# Patient Record
Sex: Female | Born: 1995 | Race: Black or African American | Hispanic: No | Marital: Single | Smoking: Never smoker
Health system: Southern US, Community
[De-identification: ages and names within clinical notes are randomized; demographics above are authoritative.]

## PROBLEM LIST (undated history)

## (undated) DIAGNOSIS — D219 Benign neoplasm of connective and other soft tissue, unspecified: Secondary | ICD-10-CM

---

## 2017-11-13 ENCOUNTER — Other Ambulatory Visit: Payer: Self-pay

## 2017-11-13 ENCOUNTER — Emergency Department (HOSPITAL_COMMUNITY)
Admission: EM | Admit: 2017-11-13 | Discharge: 2017-11-14 | Disposition: A | Discharge status: Home / Self Care | Payer: Self-pay | Attending: Emergency Medicine | Admitting: Emergency Medicine

## 2017-11-13 ENCOUNTER — Encounter (HOSPITAL_COMMUNITY): Payer: Self-pay | Admitting: Emergency Medicine

## 2017-11-13 ENCOUNTER — Other Ambulatory Visit (HOSPITAL_COMMUNITY): Payer: Self-pay | Admitting: Radiology

## 2017-11-13 ENCOUNTER — Emergency Department (HOSPITAL_COMMUNITY): Payer: Self-pay

## 2017-11-13 DIAGNOSIS — D219 Benign neoplasm of connective and other soft tissue, unspecified: Secondary | ICD-10-CM

## 2017-11-13 DIAGNOSIS — N926 Irregular menstruation, unspecified: Secondary | ICD-10-CM | POA: Insufficient documentation

## 2017-11-13 DIAGNOSIS — N83201 Unspecified ovarian cyst, right side: Secondary | ICD-10-CM | POA: Insufficient documentation

## 2017-11-13 DIAGNOSIS — D252 Subserosal leiomyoma of uterus: Secondary | ICD-10-CM | POA: Insufficient documentation

## 2017-11-13 DIAGNOSIS — N83202 Unspecified ovarian cyst, left side: Secondary | ICD-10-CM | POA: Insufficient documentation

## 2017-11-13 LAB — BASIC METABOLIC PANEL
Anion gap: 15 (ref 5–15)
BUN: 13 mg/dL (ref 6–20)
CO2: 25 mmol/L (ref 22–32)
Calcium: 9.5 mg/dL (ref 8.9–10.3)
Chloride: 99 mmol/L (ref 98–111)
Creatinine, Ser: 0.98 mg/dL (ref 0.44–1.00)
GFR calc Af Amer: >60 mL/min (ref >60)
GFR calc non Af Amer: >60 mL/min (ref >60)
Glucose, Bld: 88 mg/dL (ref 70–99)
Potassium: 4 mmol/L (ref 3.5–5.1)
Sodium: 139 mmol/L (ref 135–145)

## 2017-11-13 LAB — URINALYSIS, ROUTINE W REFLEX MICROSCOPIC
Bilirubin Urine: NEGATIVE
GLUCOSE, UA: NEGATIVE mg/dL
KETONES UR: NEGATIVE mg/dL
Nitrite: NEGATIVE
PROTEIN: NEGATIVE mg/dL
Specific Gravity, Urine: 1.023 (ref 1.005–1.030)
pH: 5 (ref 5.0–8.0)

## 2017-11-13 LAB — CBC WITH DIFFERENTIAL/PLATELET
Abs Immature Granulocytes: 0 10*3/uL (ref 0.0–0.1)
Basophils Absolute: 0.1 10*3/uL (ref 0.0–0.1)
Basophils Relative: 1 %
Eosinophils Absolute: 0.2 10*3/uL (ref 0.0–0.7)
Eosinophils Relative: 4 %
HCT: 44.6 % (ref 36.0–46.0)
Hemoglobin: 13.8 g/dL (ref 12.0–15.0)
Immature Granulocytes: 0 %
Lymphocytes Relative: 37 %
Lymphs Abs: 2.2 10*3/uL (ref 0.7–4.0)
MCH: 29.1 pg (ref 26.0–34.0)
MCHC: 30.9 g/dL (ref 30.0–36.0)
MCV: 94.1 fL (ref 78.0–100.0)
Monocytes Absolute: 0.5 10*3/uL (ref 0.1–1.0)
Monocytes Relative: 8 %
Neutro Abs: 3.1 10*3/uL (ref 1.7–7.7)
Neutrophils Relative %: 50 %
Platelets: 326 10*3/uL (ref 150–400)
RBC: 4.74 MIL/uL (ref 3.87–5.11)
RDW: 11.8 % (ref 11.5–15.5)
WBC: 6.1 10*3/uL (ref 4.0–10.5)

## 2017-11-13 LAB — I-STAT BETA HCG BLOOD, ED (MC, WL, AP ONLY): I-stat hCG, quantitative: <5 m[IU]/mL (ref <5)

## 2017-11-13 NOTE — ED Provider Notes (Signed)
[facility] EMERGENCY DEPARTMENT Provider Note   CSN: [id number] Arrival date & time: 11/13/17  1842     History   Chief Complaint Chief Complaint  Patient presents with  . Vaginal Bleeding  . Dizziness    HPI Tammy Shah is a 22 y.o. female.  The history is provided by the patient and medical records.  Vaginal Bleeding  Primary symptoms include vaginal bleeding. Associated symptoms include dizziness.  Dizziness   22 year old female presenting to the ED with irregular vaginal bleeding.  Patient reports she had a normal menstrual cycle at the end of July, was off for 5 days and then began bleeding again first week in August.  States cycle lasted for another 3-4 days and has since started again.  She does report some mild lightheadedness which is common during her menstrual cycles.  She denies any significant abdominal or pelvic pain.  She is currently sexually active with one female partner, uses protection.  She denies any new vaginal discharge or irritation.  States generally her cycles are very regular occurring every 4 weeks.  States occasionally in the past she has had her cycle, and a little early or late with change in season, but is never had it this irregular.  She has no familial history of irregular menstrual cycles.  She has no known history of fibroids, endometriosis, ovarian cyst, or other GYN abnormalities.  She is not currently on any type of birth control.  She is not currently established with OB/GYN and does not have any medical insurance.  History reviewed. No pertinent past medical history.  There are no active problems to display for this patient.      OB History   None      Home Medications    Prior to Admission medications   Not on File    Family History History reviewed. No pertinent family history.  Social History Social History   Tobacco Use  . Smoking status: Not on file  Substance Use Topics  . Alcohol use: Not on  file  . Drug use: Not on file     Allergies   Patient has no known allergies.   Review of Systems Review of Systems  Genitourinary: Positive for vaginal bleeding.  Neurological: Positive for dizziness.  All other systems reviewed and are negative.    Physical Exam Updated Vital Signs BP 109/82 (BP Location: Left Arm)   Pulse 83   Temp 98.8 F (37.1 C) (Oral)   Resp 17   Ht 5\' 3"  (1.6 m)   Wt 84.4 kg   LMP 11/13/2017   SpO2 100%   BMI 32.95 kg/m   Physical Exam  Constitutional: She is oriented to person, place, and time. She appears well-developed and well-nourished.  HENT:  Head: Normocephalic and atraumatic.  Mouth/Throat: Oropharynx is clear and moist.  Eyes: Pupils are equal, round, and reactive to light. Conjunctivae and EOM are normal.  Neck: Normal range of motion.  Cardiovascular: Normal rate, regular rhythm and normal heart sounds.  Pulmonary/Chest: Effort normal and breath sounds normal. No stridor. No respiratory distress.  Abdominal: Soft. Bowel sounds are normal. There is no tenderness. There is no rebound.  Musculoskeletal: Normal range of motion.  Neurological: She is alert and oriented to person, place, and time.  Skin: Skin is warm and dry.  Psychiatric: She has a normal mood and affect.  Nursing note and vitals reviewed.    ED Treatments / Results  Labs (all labs ordered are listed,  but only abnormal results are displayed) Labs Reviewed  URINALYSIS, ROUTINE W REFLEX MICROSCOPIC - Abnormal; Notable for the following components:      Result Value   APPearance HAZY (*)    Hgb urine dipstick LARGE (*)    Leukocytes, UA MODERATE (*)    Bacteria, UA FEW (*)    All other components within normal limits  BASIC METABOLIC PANEL  CBC WITH DIFFERENTIAL/PLATELET  I-STAT BETA HCG BLOOD, ED ([facility], [facility], [facility] ONLY)    EKG None  Radiology US Pelvis Transvanginal Non-ob (tv Only)  Result Date: 11/14/2017 CLINICAL DATA:  22 y/o  F; irregular menstrual  bleeding. EXAM: TRANSABDOMINAL AND TRANSVAGINAL ULTRASOUND OF PELVIS DOPPLER ULTRASOUND OF OVARIES TECHNIQUE: Both transabdominal and transvaginal ultrasound examinations of the pelvis were performed. Transabdominal technique was performed for global imaging of the pelvis including uterus, ovaries, adnexal regions, and pelvic cul-de-sac. It was necessary to proceed with endovaginal exam following the transabdominal exam to visualize the endometrium and ovaries. Color and duplex Doppler ultrasound was utilized to evaluate blood flow to the ovaries. COMPARISON:  None. FINDINGS: Uterus Measurements: 7.2 x 3.0 x 4.1 cm. Right exophytic subserosal anterior fundal myoma measuring 2.3 x 1.9 x 2.5 cm. Endometrium Thickness: 4.6 mm.  No focal abnormality visualized. Right ovary Measurements: 3.7 x 2.2 x 2.9 cm (volume = 12 cm^3). Normal appearance/no adnexal mass. Left ovary Measurements: 3.9 x 2.5 x 3.5 cm (volume = 18 cm^3). Normal appearance/no adnexal mass. Pulsed Doppler evaluation of both ovaries demonstrates normal low-resistance arterial and venous waveforms. Other findings No abnormal free fluid. IMPRESSION: 1. Right anterior fundal subserosal fibroid measuring 2.5 cm. 2. Polycystic ovarian morphology with multiple follicles and volume up to 18 cc. Findings may represent polycystic ovarian syndrome in the appropriate clinical setting. Clinical correlation recommended. Electronically Signed   By: Kristine Garbe M.D.   On: 11/14/2017 00:40   US Pelvis (transabdominal Only)  Result Date: 11/14/2017 CLINICAL DATA:  22 y/o  F; irregular menstrual bleeding. EXAM: TRANSABDOMINAL AND TRANSVAGINAL ULTRASOUND OF PELVIS DOPPLER ULTRASOUND OF OVARIES TECHNIQUE: Both transabdominal and transvaginal ultrasound examinations of the pelvis were performed. Transabdominal technique was performed for global imaging of the pelvis including uterus, ovaries, adnexal regions, and pelvic cul-de-sac. It was necessary to proceed  with endovaginal exam following the transabdominal exam to visualize the endometrium and ovaries. Color and duplex Doppler ultrasound was utilized to evaluate blood flow to the ovaries. COMPARISON:  None. FINDINGS: Uterus Measurements: 7.2 x 3.0 x 4.1 cm. Right exophytic subserosal anterior fundal myoma measuring 2.3 x 1.9 x 2.5 cm. Endometrium Thickness: 4.6 mm.  No focal abnormality visualized. Right ovary Measurements: 3.7 x 2.2 x 2.9 cm (volume = 12 cm^3). Normal appearance/no adnexal mass. Left ovary Measurements: 3.9 x 2.5 x 3.5 cm (volume = 18 cm^3). Normal appearance/no adnexal mass. Pulsed Doppler evaluation of both ovaries demonstrates normal low-resistance arterial and venous waveforms. Other findings No abnormal free fluid. IMPRESSION: 1. Right anterior fundal subserosal fibroid measuring 2.5 cm. 2. Polycystic ovarian morphology with multiple follicles and volume up to 18 cc. Findings may represent polycystic ovarian syndrome in the appropriate clinical setting. Clinical correlation recommended. Electronically Signed   By: Kristine Garbe M.D.   On: 11/14/2017 00:40   US Pelvic Doppler (torsion R/o Or Mass Arterial Flow)  Result Date: 11/14/2017 CLINICAL DATA:  22 y/o  F; irregular menstrual bleeding. EXAM: TRANSABDOMINAL AND TRANSVAGINAL ULTRASOUND OF PELVIS DOPPLER ULTRASOUND OF OVARIES TECHNIQUE: Both transabdominal and transvaginal ultrasound examinations of the pelvis were  performed. Transabdominal technique was performed for global imaging of the pelvis including uterus, ovaries, adnexal regions, and pelvic cul-de-sac. It was necessary to proceed with endovaginal exam following the transabdominal exam to visualize the endometrium and ovaries. Color and duplex Doppler ultrasound was utilized to evaluate blood flow to the ovaries. COMPARISON:  None. FINDINGS: Uterus Measurements: 7.2 x 3.0 x 4.1 cm. Right exophytic subserosal anterior fundal myoma measuring 2.3 x 1.9 x 2.5 cm.  Endometrium Thickness: 4.6 mm.  No focal abnormality visualized. Right ovary Measurements: 3.7 x 2.2 x 2.9 cm (volume = 12 cm^3). Normal appearance/no adnexal mass. Left ovary Measurements: 3.9 x 2.5 x 3.5 cm (volume = 18 cm^3). Normal appearance/no adnexal mass. Pulsed Doppler evaluation of both ovaries demonstrates normal low-resistance arterial and venous waveforms. Other findings No abnormal free fluid. IMPRESSION: 1. Right anterior fundal subserosal fibroid measuring 2.5 cm. 2. Polycystic ovarian morphology with multiple follicles and volume up to 18 cc. Findings may represent polycystic ovarian syndrome in the appropriate clinical setting. Clinical correlation recommended. Electronically Signed   By: Kristine Garbe M.D.   On: 11/14/2017 00:40    Procedures Procedures (including critical care time)  Medications Ordered in ED Medications - No data to display   Initial Impression / Assessment and Plan / ED Course  I have reviewed the triage vital signs and the nursing notes.  Pertinent labs & imaging results that were available during my care of the patient were reviewed by me and considered in my medical decision making (see chart for details).  22 year old female presenting to the ED with irregular menses.  Has had 3 cycles in the past 30 days which is abnormal for her.  Generally cycles are regular and occurring every 4 weeks.  Not currently on any type of birth control.  Denies any significant pelvic or abdominal pain, vaginal discharge, new sexual partner, or concern for STD.  Labs today overall reassuring, hemoglobin is stable.  Pregnancy test is negative.  Abdomen is soft/benign.  Given her abrupt change in menstrual cycle, concern for underlying etiology.  Will obtain pelvic ultrasound.  Ultrasound with findings of uterine fibroid as well as bilateral ovarian cysts, possible PCOS. No evidence of torsion or other emergent pathology. Patient has no prior US available for  comparison.  Results discussed with patient, she verbalized understanding.  Will refer to OB-GYN for further evaluation/management.  He will return here for any new/acute changes.  Final Clinical Impressions(s) / ED Diagnoses   Final diagnoses:  Irregular menses  Fibroids  Bilateral ovarian cysts    ED Discharge Orders    None       Larene Pickett, PA-C 11/14/17 1478    Dorie Rank, MD 11/14/17 1139

## 2017-11-13 NOTE — ED Triage Notes (Signed)
Patient from home stating that she's had her menstrual cycle 3 times in the past month with heavy bleeding. c/o dizziness which she states is normal for her when she's on her menstrual cycle. Is sexually active and has not been using any birth control. No abdominal pain or discomfort.

## 2017-11-14 NOTE — ED Notes (Signed)
Patient verbalizes understanding of medications and discharge instructions. No further questions at this time. VSS and patient ambulatory at discharge.   

## 2017-11-14 NOTE — Discharge Instructions (Signed)
Your ultrasound today did show some fibroids as well as some ovarian cysts, possibly PCOS. We would like you to follow-up with an OB/GYN closely for further evaluation/management. Return here for any new/acute changes.

## 2017-12-24 ENCOUNTER — Encounter (HOSPITAL_COMMUNITY): Payer: Self-pay

## 2017-12-24 ENCOUNTER — Other Ambulatory Visit: Payer: Self-pay

## 2017-12-24 ENCOUNTER — Emergency Department (HOSPITAL_COMMUNITY)
Admission: EM | Admit: 2017-12-24 | Discharge: 2017-12-24 | Disposition: A | Discharge status: Home / Self Care | Payer: Self-pay | Attending: Emergency Medicine | Admitting: Emergency Medicine

## 2017-12-24 DIAGNOSIS — J029 Acute pharyngitis, unspecified: Secondary | ICD-10-CM | POA: Insufficient documentation

## 2017-12-24 LAB — GROUP A STREP BY PCR: GROUP A STREP BY PCR: NOT DETECTED

## 2017-12-24 MED ORDER — ACETAMINOPHEN 325 MG PO TABS
650.0000 mg | ORAL_TABLET | Freq: Once | ORAL | Status: AC | PRN
  Administered 2017-12-24: 650 mg via ORAL
  Filled 2017-12-24: qty 2

## 2017-12-24 MED ORDER — DEXAMETHASONE 4 MG PO TABS
10.0000 mg | ORAL_TABLET | Freq: Once | ORAL | Status: AC
  Administered 2017-12-24: 10 mg via ORAL
  Filled 2017-12-24: qty 3

## 2017-12-24 MED ORDER — BENZONATATE 100 MG PO CAPS: 100.0000 mg | ORAL_CAPSULE | Freq: Three times a day (TID) | ORAL | 0 refills | Status: DC

## 2017-12-24 NOTE — ED Provider Notes (Signed)
[facility] EMERGENCY DEPARTMENT Provider Note   CSN: [id number] Arrival date & time: 12/24/17  1349     History   Chief Complaint Chief Complaint  Patient presents with  . Sore Throat    HPI Tammy Shah is a 22 y.o. female.  22 yo F with a chief complaint of sore throat and fever.  Going on for the past 3 days.  Unsure if she is been around any sick contacts.  Has had strep throat before and thinks this feels slightly worse.  Has been coughing which just started today.  Denies shortness of breath denies confusion.  Able to eat and drink at home but painful.  The history is provided by the patient.  Sore Throat  This is a new problem. The current episode started more than 2 days ago. The problem occurs constantly. The problem has been gradually worsening. Pertinent negatives include no chest pain, no headaches and no shortness of breath. The symptoms are aggravated by swallowing. Nothing relieves the symptoms. She has tried nothing for the symptoms. The treatment provided no relief.  Illness  Pertinent negatives include no chest pain, no headaches and no shortness of breath.    History reviewed. No pertinent past medical history.  There are no active problems to display for this patient.   History reviewed. No pertinent surgical history.   OB History   None      Home Medications    Prior to Admission medications   Medication Sig Start Date End Date Taking? Authorizing Provider  benzonatate (TESSALON) 100 MG capsule Take 1 capsule (100 mg total) by mouth every 8 (eight) hours. 12/24/17   Deno Etienne, DO    Family History History reviewed. No pertinent family history.  Social History Social History   Tobacco Use  . Smoking status: Never Smoker  Substance Use Topics  . Alcohol use: Yes    Frequency: Never    Comment: occ  . Drug use: Never     Allergies   Patient has no known allergies.   Review of Systems Review of Systems    Constitutional: Positive for fever. Negative for chills.  HENT: Positive for congestion and sore throat. Negative for rhinorrhea.   Eyes: Negative for redness and visual disturbance.  Respiratory: Positive for cough. Negative for shortness of breath and wheezing.   Cardiovascular: Negative for chest pain and palpitations.  Gastrointestinal: Negative for nausea and vomiting.  Genitourinary: Negative for dysuria and urgency.  Musculoskeletal: Negative for arthralgias and myalgias.  Skin: Negative for pallor and wound.  Neurological: Negative for dizziness and headaches.     Physical Exam Updated Vital Signs BP 124/79 (BP Location: Right Arm)   Pulse (!) 126   Temp (!) 103.2 F (39.6 C) (Oral)   Resp 16   Ht 5\' 3"  (1.6 m)   Wt 80.7 kg   LMP 12/02/2017 (Approximate)   SpO2 98%   BMI 31.53 kg/m   Physical Exam  Constitutional: She is oriented to person, place, and time. She appears well-developed and well-nourished. No distress.  HENT:  Head: Normocephalic and atraumatic.  Bilateral tonsillar swelling and erythema.  There is purulent drainage to bilateral tonsils.  She has left anterior cervical lymphadenopathy.  Tolerating secretions without difficulty.  Turning head without difficulty.  TMs with trace effusions bilaterally.  No sinus tenderness to percussion.  Eyes: Pupils are equal, round, and reactive to light. EOM are normal.  Neck: Normal range of motion. Neck supple.  Cardiovascular: Normal rate  and regular rhythm. Exam reveals no gallop and no friction rub.  No murmur heard. Pulmonary/Chest: Effort normal. She has no wheezes. She has no rales.  Abdominal: Soft. She exhibits no distension. There is no tenderness.  Musculoskeletal: She exhibits no edema or tenderness.  Neurological: She is alert and oriented to person, place, and time.  Skin: Skin is warm and dry. She is not diaphoretic.  Psychiatric: She has a normal mood and affect. Her behavior is normal.  Nursing note  and vitals reviewed.    ED Treatments / Results  Labs (all labs ordered are listed, but only abnormal results are displayed) Labs Reviewed  GROUP A STREP BY PCR    EKG None  Radiology No results found.  Procedures Procedures (including critical care time)  Medications Ordered in ED Medications  acetaminophen (TYLENOL) tablet 650 mg (650 mg Oral Given 12/24/17 1417)  dexamethasone (DECADRON) tablet 10 mg (10 mg Oral Given 12/24/17 1447)     Initial Impression / Assessment and Plan / ED Course  I have reviewed the triage vital signs and the nursing notes.  Pertinent labs & imaging results that were available during my care of the patient were reviewed by me and considered in my medical decision making (see chart for details).     22 yo F with a chief complaint of a sore throat.  She is well-appearing and nontoxic.  She has bilateral tonsillar swelling with exudates.  She is coughing.  Will wait for rapid strep.  Give a dose of Decadron.  Rapid strep negative.  D/c home.   3:26 PM:  I have discussed the diagnosis/risks/treatment options with the patient and believe the pt to be eligible for discharge home to follow-up with PCP. We also discussed returning to the ED immediately if new or worsening sx occur. We discussed the sx which are most concerning (e.g., sudden worsening pain, fever, inability to tolerate by mouth) that necessitate immediate return. Medications administered to the patient during their visit and any new prescriptions provided to the patient are listed below.  Medications given during this visit Medications  acetaminophen (TYLENOL) tablet 650 mg (650 mg Oral Given 12/24/17 1417)  dexamethasone (DECADRON) tablet 10 mg (10 mg Oral Given 12/24/17 1447)      The patient appears reasonably screen and/or stabilized for discharge and I doubt any other medical condition or other [facility] requiring further screening, evaluation, or treatment in the ED at this time prior  to discharge.    Final Clinical Impressions(s) / ED Diagnoses   Final diagnoses:  Viral pharyngitis    ED Discharge Orders         Ordered    benzonatate (TESSALON) 100 MG capsule  Every 8 hours     12/24/17 [place], [place], DO 12/24/17 1526

## 2017-12-24 NOTE — ED Triage Notes (Signed)
Pt endorses sore throat x 3 days, hx of strep and this feels the same. White patches in throat. Febrile.

## 2017-12-24 NOTE — Discharge Instructions (Signed)
Take tylenol 2 pills 4 times a day and motrin 4 pills 3 times a day.  Drink plenty of fluids.  Return for worsening shortness of breath, headache, confusion. Follow up with your family doctor.   

## 2017-12-26 ENCOUNTER — Encounter (HOSPITAL_COMMUNITY): Payer: Self-pay | Admitting: *Deleted

## 2017-12-26 ENCOUNTER — Emergency Department (HOSPITAL_COMMUNITY): Payer: Self-pay

## 2017-12-26 ENCOUNTER — Emergency Department (HOSPITAL_COMMUNITY)
Admission: EM | Admit: 2017-12-26 | Discharge: 2017-12-26 | Disposition: A | Discharge status: Home / Self Care | Payer: Self-pay | Attending: Emergency Medicine | Admitting: Emergency Medicine

## 2017-12-26 DIAGNOSIS — J029 Acute pharyngitis, unspecified: Secondary | ICD-10-CM | POA: Insufficient documentation

## 2017-12-26 LAB — GROUP A STREP BY PCR: GROUP A STREP BY PCR: NOT DETECTED

## 2017-12-26 MED ORDER — ACETAMINOPHEN 325 MG PO TABS
650.0000 mg | ORAL_TABLET | Freq: Once | ORAL | Status: AC | PRN
  Administered 2017-12-26: 650 mg via ORAL
  Filled 2017-12-26: qty 2

## 2017-12-26 MED ORDER — PENICILLIN V POTASSIUM 500 MG PO TABS: 500.0000 mg | ORAL_TABLET | Freq: Four times a day (QID) | ORAL | 0 refills | Status: AC

## 2017-12-26 MED ORDER — PENICILLIN V POTASSIUM 500 MG PO TABS: 500.0000 mg | ORAL_TABLET | Freq: Four times a day (QID) | ORAL | 0 refills | Status: DC

## 2017-12-26 NOTE — ED Triage Notes (Signed)
Pt in c/o continued cough and congestion and sore throat, seen a few days ago with negative strep but returned due to continued symptoms

## 2017-12-26 NOTE — ED Notes (Signed)
Patient verbalizes understanding of discharge instructions. Opportunity for questioning and answers were provided. Pt discharged from ED. 

## 2017-12-26 NOTE — Discharge Instructions (Addendum)
Please read attached information. If you experience any new or worsening signs or symptoms please return to the emergency room for evaluation. Please follow-up with your primary care provider or specialist as discussed. Please use medication prescribed only as directed and discontinue taking if you have any concerning signs or symptoms.   °

## 2017-12-26 NOTE — ED Provider Notes (Signed)
[place] EMERGENCY DEPARTMENT Provider Note   CSN: [id number] Arrival date & time: 12/26/17  1357    History   Chief Complaint Chief Complaint  Patient presents with  . URI    HPI Tammy Shah is a 22 y.o. female.  HPI   22 year old female presents today with complaints of sore throat.  Patient notes symptoms started 5 days ago.  Patient notes painful swallowing, swelling on her tonsils and exudate.  She was seen 2 days ago and was noted to be febrile with exudate, her strep was negative, she was discharged home after Decadron and given cough medication.  Patient notes symptoms have continued to persist, painful swallowing, fever.  She notes using Tylenol, gargling, and cough medication without improvement in symptoms.  Patient she is sexually active, but denies any oral sexual activity, no partners with STD symptoms.  Notes a dry nonproductive cough as well.     History reviewed. No pertinent past medical history.  There are no active problems to display for this patient.   History reviewed. No pertinent surgical history.   OB History   None      Home Medications    Prior to Admission medications   Medication Sig Start Date End Date Taking? Authorizing Provider  benzonatate (TESSALON) 100 MG capsule Take 1 capsule (100 mg total) by mouth every 8 (eight) hours. 12/24/17   Deno Etienne, DO  penicillin v potassium (VEETID) 500 MG tablet Take 1 tablet (500 mg total) by mouth 4 (four) times daily for 10 days. 12/26/17 01/05/18  Okey Regal, PA-C    Family History History reviewed. No pertinent family history.  Social History Social History   Tobacco Use  . Smoking status: Never Smoker  Substance Use Topics  . Alcohol use: Yes    Frequency: Never    Comment: occ  . Drug use: Never     Allergies   Patient has no known allergies.   Review of Systems Review of Systems  All other systems reviewed and are negative.    Physical  Exam Updated Vital Signs BP 115/78   Pulse 87   Temp (!) 101.9 F (38.8 C) (Oral)   Resp 16   LMP 12/02/2017 (Approximate)   SpO2 99%   Physical Exam  Constitutional: She is oriented to person, place, and time. She appears well-developed and well-nourished.  HENT:  Head: Normocephalic and atraumatic.  Bilateral symmetric tonsillar enlargement with erythema and exudate, uvula is midline rises with phonation no edema noted, no pooling of secretions, no signs of PTA, RPA-tender bilateral anterior lymphadenopathy, no posterior cervical lymph node enlargement-neck is supple  Eyes: Pupils are equal, round, and reactive to light. Conjunctivae are normal. Right eye exhibits no discharge. Left eye exhibits no discharge. No scleral icterus.  Neck: Normal range of motion. No JVD present. No tracheal deviation present.  Pulmonary/Chest: Effort normal and breath sounds normal. No stridor. No respiratory distress. She has no wheezes. She has no rales. She exhibits no tenderness.  Neurological: She is alert and oriented to person, place, and time. Coordination normal.  Psychiatric: She has a normal mood and affect. Her behavior is normal. Judgment and thought content normal.  Nursing note and vitals reviewed.    ED Treatments / Results  Labs (all labs ordered are listed, but only abnormal results are displayed) Labs Reviewed  GROUP A STREP BY PCR    EKG None  Radiology Dg Chest 2 View  Result Date: 12/26/2017 CLINICAL DATA:  Patient  noted white spots on the back of throat with fever starting 12/21/2017. EXAM: CHEST - 2 VIEW COMPARISON:  None. FINDINGS: The heart size and mediastinal contours are within normal limits. Both lungs are clear. The visualized skeletal structures are unremarkable. IMPRESSION: No active cardiopulmonary disease. Electronically Signed   By: Ashley Royalty M.D.   On: 12/26/2017 15:54    Procedures Procedures (including critical care time)  Medications Ordered in  ED Medications  acetaminophen (TYLENOL) tablet 650 mg (650 mg Oral Given 12/26/17 1450)     Initial Impression / Assessment and Plan / ED Course  I have reviewed the triage vital signs and the nursing notes.  Pertinent labs & imaging results that were available during my care of the patient were reviewed by me and considered in my medical decision making (see chart for details).     Labs: Group A strep  Imaging: DG chest 2 view  Consults:  Therapeutics:  Discharge Meds: Penicillin  Assessment/Plan: 22 year old female presents today with pharyngitis.  Question viral versus bacterial, patient has persistent fever with significant tonsillar swelling exudate.  Given persistence of fever patient will be started on antibiotics at this time, she were to return immediately if she develops any new or worsening signs or symptoms.  I see no signs of deep space infection that require further evaluation management at this time.  Patient discharged with outpatient follow-up and strict return precautions, she verbalized understanding and agreement to today's plan had no further questions or concerns.    Final Clinical Impressions(s) / ED Diagnoses   Final diagnoses:  Pharyngitis, unspecified etiology    ED Discharge Orders         Ordered    penicillin v potassium (VEETID) 500 MG tablet  4 times daily,   Status:  Discontinued     12/26/17 1808    penicillin v potassium (VEETID) 500 MG tablet  4 times daily     12/26/17 1810           Okey Regal, PA-C 12/26/17 2136    Varney Biles, MD 12/29/17 1503

## 2018-03-06 ENCOUNTER — Emergency Department (HOSPITAL_COMMUNITY)
Admission: EM | Admit: 2018-03-06 | Discharge: 2018-03-06 | Disposition: A | Discharge status: Home / Self Care | Payer: Self-pay | Attending: Emergency Medicine | Admitting: Emergency Medicine

## 2018-03-06 ENCOUNTER — Encounter (HOSPITAL_COMMUNITY): Payer: Self-pay | Admitting: Emergency Medicine

## 2018-03-06 ENCOUNTER — Emergency Department (HOSPITAL_COMMUNITY): Payer: Self-pay

## 2018-03-06 ENCOUNTER — Other Ambulatory Visit: Payer: Self-pay

## 2018-03-06 DIAGNOSIS — Z3A10 10 weeks gestation of pregnancy: Secondary | ICD-10-CM | POA: Insufficient documentation

## 2018-03-06 DIAGNOSIS — O219 Vomiting of pregnancy, unspecified: Secondary | ICD-10-CM | POA: Insufficient documentation

## 2018-03-06 DIAGNOSIS — Z3491 Encounter for supervision of normal pregnancy, unspecified, first trimester: Secondary | ICD-10-CM

## 2018-03-06 DIAGNOSIS — O210 Mild hyperemesis gravidarum: Secondary | ICD-10-CM

## 2018-03-06 LAB — I-STAT BETA HCG BLOOD, ED (MC, WL, AP ONLY): I-stat hCG, quantitative: >2000 m[IU]/mL — ABNORMAL HIGH (ref <5)

## 2018-03-06 MED ORDER — PRENATAL COMPLETE 14-0.4 MG PO TABS: 1.0000 | ORAL_TABLET | Freq: Every day | ORAL | 0 refills | Status: AC

## 2018-03-06 NOTE — ED Triage Notes (Signed)
Pt reports vomiting for several weeks, unable to keep anything besides soda down. Pt denies sick contact, fever, or diarrhea. Pt reports some lower abdominal cramping but reports she has hx of cysts/fibroids and this is typical for her.

## 2018-03-06 NOTE — ED Notes (Signed)
This tech attempted to draw Pt's bloodwork and was unsuccessful. RN aware.

## 2018-03-06 NOTE — ED Notes (Signed)
Pt transported to Ultrasound.  

## 2018-03-06 NOTE — ED Provider Notes (Signed)
[facility] Provider Note   CSN: [id number] Arrival date & time: 03/06/18  1552     History   Chief Complaint Chief Complaint  Patient presents with  . Emesis    HPI Tammy Shah is a 22 y.o. female.  HPI   She presents for evaluation of nausea ongoing for 2 weeks, with occasional vomiting.  Last menstrual cycle 02/04/2018.  She thinks she might be pregnant and has had 1 of 2+ home pregnancy test.  She denies vaginal bleeding or drainage of vaginal fluid.  She denies diarrhea, fever, chills, weakness or dizziness.  There are no other known modifying factors.  History reviewed. No pertinent past medical history.  There are no active problems to display for this patient.   History reviewed. No pertinent surgical history.   OB History   No obstetric history on file.      Home Medications    Prior to Admission medications   Medication Sig Start Date End Date Taking? Authorizing Provider  benzonatate (TESSALON) 100 MG capsule Take 1 capsule (100 mg total) by mouth every 8 (eight) hours. 12/24/17   Deno Etienne, DO    Family History History reviewed. No pertinent family history.  Social History Social History   Tobacco Use  . Smoking status: Never Smoker  . Smokeless tobacco: Never Used  Substance Use Topics  . Alcohol use: Yes    Alcohol/week: 3.0 standard drinks    Types: 3 Glasses of wine per week    Frequency: Never  . Drug use: Never     Allergies   Patient has no known allergies.   Review of Systems Review of Systems  All other systems reviewed and are negative.    Physical Exam Updated Vital Signs BP 112/73   Pulse 80   Temp 98.3 F (36.8 C) (Oral)   Resp 18   LMP 02/04/2018   SpO2 100%   Physical Exam Vitals signs and nursing note reviewed.  Constitutional:      Appearance: She is well-developed.  HENT:     Head: Normocephalic and atraumatic.     Right Ear: External ear normal.     Left  Ear: External ear normal.  Eyes:     Conjunctiva/sclera: Conjunctivae normal.     Pupils: Pupils are equal, round, and reactive to light.  Neck:     Musculoskeletal: Normal range of motion and neck supple.     Trachea: Phonation normal.  Cardiovascular:     Rate and Rhythm: Normal rate and regular rhythm.     Heart sounds: Normal heart sounds.  Pulmonary:     Effort: Pulmonary effort is normal.     Breath sounds: Normal breath sounds.  Abdominal:     Palpations: Abdomen is soft.     Tenderness: There is no abdominal tenderness.  Musculoskeletal: Normal range of motion.  Skin:    General: Skin is warm and dry.  Neurological:     Mental Status: She is alert and oriented to person, place, and time.     Cranial Nerves: No cranial nerve deficit.     Sensory: No sensory deficit.     Motor: No abnormal muscle tone.     Coordination: Coordination normal.  Psychiatric:        Behavior: Behavior normal.        Thought Content: Thought content normal.        Judgment: Judgment normal.      ED Treatments / Results  Labs (  all labs ordered are listed, but only abnormal results are displayed) Labs Reviewed - No data to display  EKG None  Radiology No results found.  Procedures Procedures (including critical care time)  Medications Ordered in ED Medications - No data to display   Initial Impression / Assessment and Plan / ED Course  I have reviewed the triage vital signs and the nursing notes.  Pertinent labs & imaging results that were available during my care of the patient were reviewed by me and considered in my medical decision making (see chart for details).  Clinical Course as of Mar 06 1904  Wed Mar 06, 2018  1729 Patient wants to have an ultrasound done because she wants to know how far along she is.  I explained that her insurance may not pay for it.  She wants to proceed.   [EW]    Clinical Course User Index [EW] Daleen Bo, MD     Patient Vitals for  the past 24 hrs:  BP Temp Temp src Pulse Resp SpO2  03/06/18 1559 112/73 98.3 F (36.8 C) Oral 80 18 100 %    5:25 PM Reevaluation with update and discussion. After initial assessment and treatment, an updated evaluation reveals she remains comfortable.  Current findings discussed with the patient.  We will proceed with ultrasound imaging regarding dates. Daleen Bo   Medical Decision Making: Nausea likely related to early pregnancy, dates not clear, ultrasound ordered.  I suspect that she has morning sickness, likely the pregnancy is 2 to 3 months, not to 3 weeks as would be measured by her last known menses.  This is her first pregnancy.  CRITICAL CARE-no Performed by: Daleen Bo   Nursing Notes Reviewed/ Care Coordinated Applicable Imaging Reviewed Interpretation of Laboratory Data incorporated into ED treatment   Plan-disposition by on-coming provider team, following ultrasound imaging  Final Clinical Impressions(s) / ED Diagnoses   Final diagnoses:  First trimester pregnancy  Morning sickness    ED Discharge Orders    None       Daleen Bo, MD 03/06/18 Einar Crow

## 2018-03-06 NOTE — Discharge Instructions (Addendum)
Begin taking prenatal vitamins.  Please follow-up with OB/GYN for prenatal care.  Please return the emergency department you develop any new or worsening symptoms.

## 2018-04-30 ENCOUNTER — Emergency Department (HOSPITAL_COMMUNITY)
Admission: EM | Admit: 2018-04-30 | Discharge: 2018-04-30 | Disposition: A | Discharge status: Home / Self Care | Payer: Self-pay | Attending: Emergency Medicine | Admitting: Emergency Medicine

## 2018-04-30 ENCOUNTER — Encounter (HOSPITAL_COMMUNITY): Payer: Self-pay

## 2018-04-30 ENCOUNTER — Emergency Department (HOSPITAL_COMMUNITY): Payer: Self-pay

## 2018-04-30 ENCOUNTER — Other Ambulatory Visit: Payer: Self-pay

## 2018-04-30 DIAGNOSIS — Z79899 Other long term (current) drug therapy: Secondary | ICD-10-CM | POA: Insufficient documentation

## 2018-04-30 DIAGNOSIS — N760 Acute vaginitis: Secondary | ICD-10-CM | POA: Insufficient documentation

## 2018-04-30 DIAGNOSIS — R1031 Right lower quadrant pain: Secondary | ICD-10-CM

## 2018-04-30 DIAGNOSIS — B9689 Other specified bacterial agents as the cause of diseases classified elsewhere: Secondary | ICD-10-CM

## 2018-04-30 HISTORY — DX: Benign neoplasm of connective and other soft tissue, unspecified: D21.9

## 2018-04-30 LAB — WET PREP, GENITAL
Sperm: NONE SEEN
Trich, Wet Prep: NONE SEEN
Yeast Wet Prep HPF POC: NONE SEEN

## 2018-04-30 LAB — COMPREHENSIVE METABOLIC PANEL
ALT: 13 U/L (ref 0–44)
AST: 18 U/L (ref 15–41)
Albumin: 3.6 g/dL (ref 3.5–5.0)
Alkaline Phosphatase: 67 U/L (ref 38–126)
Anion gap: 11 (ref 5–15)
BUN: 14 mg/dL (ref 6–20)
CO2: 25 mmol/L (ref 22–32)
CREATININE: 0.81 mg/dL (ref 0.44–1.00)
Calcium: 9 mg/dL (ref 8.9–10.3)
Chloride: 102 mmol/L (ref 98–111)
GFR calc Af Amer: >60 mL/min (ref >60)
GFR calc non Af Amer: >60 mL/min (ref >60)
Glucose, Bld: 85 mg/dL (ref 70–99)
Potassium: 4.1 mmol/L (ref 3.5–5.1)
Sodium: 138 mmol/L (ref 135–145)
Total Bilirubin: 0.3 mg/dL (ref 0.3–1.2)
Total Protein: 6.7 g/dL (ref 6.5–8.1)

## 2018-04-30 LAB — URINALYSIS, ROUTINE W REFLEX MICROSCOPIC
BILIRUBIN URINE: NEGATIVE
Glucose, UA: NEGATIVE mg/dL
Hgb urine dipstick: NEGATIVE
Ketones, ur: NEGATIVE mg/dL
Leukocytes,Ua: NEGATIVE
NITRITE: NEGATIVE
Protein, ur: NEGATIVE mg/dL
Specific Gravity, Urine: 1.028 (ref 1.005–1.030)
pH: 6 (ref 5.0–8.0)

## 2018-04-30 LAB — CBC
HCT: 37.5 % (ref 36.0–46.0)
Hemoglobin: 11.9 g/dL — ABNORMAL LOW (ref 12.0–15.0)
MCH: 29.8 pg (ref 26.0–34.0)
MCHC: 31.7 g/dL (ref 30.0–36.0)
MCV: 93.8 fL (ref 80.0–100.0)
Platelets: 368 10*3/uL (ref 150–400)
RBC: 4 MIL/uL (ref 3.87–5.11)
RDW: 11.9 % (ref 11.5–15.5)
WBC: 6.9 10*3/uL (ref 4.0–10.5)
nRBC: 0 % (ref 0.0–0.2)

## 2018-04-30 LAB — I-STAT BETA HCG BLOOD, ED (MC, WL, AP ONLY)

## 2018-04-30 LAB — LIPASE, BLOOD: Lipase: 57 U/L — ABNORMAL HIGH (ref 11–51)

## 2018-04-30 MED ORDER — METRONIDAZOLE 500 MG PO TABS: 500.0000 mg | ORAL_TABLET | Freq: Two times a day (BID) | ORAL | 0 refills | Status: DC

## 2018-04-30 MED ORDER — IOHEXOL 300 MG/ML  SOLN
100.0000 mL | Freq: Once | INTRAMUSCULAR | Status: AC | PRN
  Administered 2018-04-30: 100 mL via INTRAVENOUS

## 2018-04-30 MED ORDER — IBUPROFEN 600 MG PO TABS: 600.0000 mg | ORAL_TABLET | Freq: Four times a day (QID) | ORAL | 0 refills | Status: AC | PRN

## 2018-04-30 MED ORDER — SODIUM CHLORIDE 0.9% FLUSH: 3.0000 mL | Freq: Once | INTRAVENOUS | Status: DC

## 2018-04-30 NOTE — Discharge Instructions (Signed)
Take ibuprofen every 6 hours for your pain.  Take Flagyl twice daily until completed.  Do not drink alcohol with this medication.  Please follow-up and establish care with [facility] to establish care with a PCP.  Please follow-up with OB/GYN at the [facility] outpatient clinic if your symptoms are not improving.  Please return the emergency department develop any new or worsening symptoms

## 2018-04-30 NOTE — ED Triage Notes (Signed)
Pt reports RLQ pain that radiates to her right knee, denies recent injury, no n/v/d. Pt ambulatory, nad noted

## 2018-04-30 NOTE — ED Notes (Signed)
Patient verbalizes understanding of discharge instructions. Opportunity for questioning and answers were provided. Armband removed by staff, pt discharged from ED ambulatory.   

## 2018-04-30 NOTE — ED Notes (Signed)
Attempted IV x2. 

## 2018-05-01 LAB — GC/CHLAMYDIA PROBE AMP (~~LOC~~) NOT AT ARMC
Chlamydia: POSITIVE — AB
Neisseria Gonorrhea: NEGATIVE

## 2018-05-01 NOTE — ED Provider Notes (Signed)
[facility] EMERGENC[facility] DEPARTMENT Provider Note   CSN: [id number] Arrival date & time: 04/30/18  1712     History   Chief Complaint Chief Complaint  Patient presents with  . Abdominal Pain  . Knee Pain    HPI Tammy Shah is a 23 y.o. female with history of uterine fibroid and ovarian cyst who presents with a one-week history of right lower quadrant pain radiating to the right knee.  Patient describes the pain is intermittent, but becoming more constant over the past couple days.  She denies any nausea, vomiting, diarrhea, fevers, urinary symptoms.  She denies any abnormal vaginal discharge or bleeding.  She denies any chest pain, shortness of breath.  She is sexually active with 1 partner and denies any concern for STD exposure.  She has not taken any medications at home for her symptoms.  HPI  Past Medical History:  Diagnosis Date  . Fibroids    uterine    There are no active problems to display for this patient.   History reviewed. No pertinent surgical history.   OB History   No obstetric history on file.      Home Medications    Prior to Admission medications   Medication Sig Start Date End Date Taking? Authorizing Provider  benzonatate (TESSALON) 100 MG capsule Take 1 capsule (100 mg total) by mouth every 8 (eight) hours. Patient not taking: Reported on 03/06/2018 12/24/17   Deno Etienne, DO  ibuprofen (ADVIL,MOTRIN) 600 MG tablet Take 1 tablet (600 mg total) by mouth every 6 (six) hours as needed. 04/30/18   Tryniti Laatsch, Bea Graff, PA-C  metroNIDAZOLE (FLAGYL) 500 MG tablet Take 1 tablet (500 mg total) by mouth 2 (two) times daily. 04/30/18   Frederica Kuster, PA-C  Prenatal Vit-Fe Fumarate-FA (PRENATAL COMPLETE) 14-0.4 MG TABS Take 1 tablet by mouth daily. 03/06/18   Frederica Kuster, PA-C    Family History No family history on file.  Social History Social History   Tobacco Use  . Smoking status: Never Smoker  . Smokeless tobacco: Never Used    Substance Use Topics  . Alcohol use: Yes    Alcohol/week: 3.0 standard drinks    Types: 3 Glasses of wine per week    Frequency: Never  . Drug use: Never     Allergies   Patient has no known allergies.   Review of Systems Review of Systems  Constitutional: Negative for chills and fever.  HENT: Negative for facial swelling and sore throat.   Respiratory: Negative for shortness of breath.   Cardiovascular: Negative for chest pain.  Gastrointestinal: Positive for abdominal pain. Negative for nausea and vomiting.  Genitourinary: Negative for dysuria, frequency, vaginal bleeding and vaginal discharge.  Musculoskeletal: Negative for back pain.  Skin: Negative for rash and wound.  Neurological: Negative for headaches.  Psychiatric/Behavioral: The patient is not nervous/anxious.      Physical Exam Updated Vital Signs BP 114/74 (BP Location: Left Arm)   Pulse 80   Temp 99.2 F (37.3 C) (Oral)   Resp 14   SpO2 100%   Physical Exam Vitals signs and nursing note reviewed. Exam conducted with a chaperone present.  Constitutional:      General: She is not in acute distress.    Appearance: She is well-developed. She is not diaphoretic.  HENT:     Head: Normocephalic and atraumatic.     Mouth/Throat:     Pharynx: No oropharyngeal exudate.  Eyes:     General: No  scleral icterus.       Right eye: No discharge.        Left eye: No discharge.     Conjunctiva/sclera: Conjunctivae normal.     Pupils: Pupils are equal, round, and reactive to light.  Neck:     Musculoskeletal: Normal range of motion and neck supple.     Thyroid: No thyromegaly.  Cardiovascular:     Rate and Rhythm: Normal rate and regular rhythm.     Heart sounds: Normal heart sounds. No murmur. No friction rub. No gallop.   Pulmonary:     Effort: Pulmonary effort is normal. No respiratory distress.     Breath sounds: Normal breath sounds. No stridor. No wheezing or rales.  Abdominal:     General: Bowel sounds  are normal. There is no distension.     Palpations: Abdomen is soft.     Tenderness: There is abdominal tenderness (mild, very low) in the right lower quadrant. There is no guarding or rebound.  Genitourinary:    Vagina: Vaginal discharge (scant, white) present.     Cervix: No cervical motion tenderness.     Adnexa:        Right: Tenderness (mild) present.   Musculoskeletal:     Comments: No tenderness palpation on the right upper likely  Lymphadenopathy:     Cervical: No cervical adenopathy.  Skin:    General: Skin is warm and dry.     Coloration: Skin is not pale.     Findings: No rash.  Neurological:     Mental Status: She is alert.     Coordination: Coordination normal.      ED Treatments / Results  Labs (all labs ordered are listed, but only abnormal results are displayed) Labs Reviewed  WET PREP, GENITAL - Abnormal; Notable for the following components:      Result Value   Clue Cells Wet Prep HPF POC PRESENT (*)    WBC, Wet Prep HPF POC MANY (*)    All other components within normal limits  LIPASE, BLOOD - Abnormal; Notable for the following components:   Lipase 57 (*)    All other components within normal limits  CBC - Abnormal; Notable for the following components:   Hemoglobin 11.9 (*)    All other components within normal limits  COMPREHENSIVE METABOLIC PANEL  URINALYSIS, ROUTINE W REFLEX MICROSCOPIC  I-STAT BETA HCG BLOOD, ED ([facility], [facility], [facility] ONLY)  GC/CHLAMYDIA PROBE AMP ([place]) NOT AT [facility]    EKG None  Radiology Ct Abdomen Pelvis W Contrast  Result Date: 04/30/2018 CLINICAL DATA:  Right lower quadrant pain EXAM: CT ABDOMEN AND PELVIS WITH CONTRAST TECHNIQUE: Multidetector CT imaging of the abdomen and pelvis was performed using the standard protocol following bolus administration of intravenous contrast. CONTRAST:  120mL OMNIPAQUE IOHEXOL 300 MG/ML  SOLN COMPARISON:  None. FINDINGS: Lower chest: No acute abnormality. Hepatobiliary: No focal liver  abnormality is seen. No gallstones, gallbladder wall thickening, or biliary dilatation. Pancreas: Unremarkable. No pancreatic ductal dilatation or surrounding inflammatory changes. Spleen: Normal in size without focal abnormality. Adrenals/Urinary Tract: Adrenal glands are unremarkable. Kidneys are normal, without renal calculi, focal lesion, or hydronephrosis. Bladder is unremarkable. Stomach/Bowel: The appendix is within normal limits. No obstructive or inflammatory changes of the larger small-bowel are noted. Stomach is within normal limits. Vascular/Lymphatic: No significant vascular findings are present. No enlarged abdominal or pelvic lymph nodes. Reproductive: Uterus and bilateral adnexa are unremarkable. Other: No abdominal wall hernia or abnormality. No abdominopelvic ascites.  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 No acute bony abnormality is noted. IMPRESSION: The appendix is within normal limits. No acute abnormality noted. Electronically Signed   By: Inez Catalina M.D.   On: 04/30/2018 23:26    Procedures Procedures (including critical care time)  Medications Ordered in ED Medications  iohexol (OMNIPAQUE) 300 MG/ML solution 100 mL (100 mLs Intravenous Contrast Given 04/30/18 2307)     Initial Impression / Assessment and Plan / ED Course  I have reviewed the triage vital signs and the nursing notes.  Pertinent labs & imaging results that were available during my care of the patient were reviewed by me and considered in my medical decision making (see chart for details).     Patient presenting with right lower quadrant pain radiating to the right knee.  Patient is well-appearing and in no acute distress.  Labs are unremarkable except for mild anemia with hemoglobin 11.9 and lipase 57.  CT abdomen pelvis is negative for acute findings.  UA is negative.  Wet prep does show clue cells.  Will treat with Flagyl.   Suspect musculoskeletal cause versus ruptured ovarian cyst, considering history.  There are no emergent or surgical findings today.  Will treat with NSAIDs.  Return precautions discussed.  Patient understands and agrees with plan.  Patient vitals stable throughout ED course and discharged in satisfactory condition.  Final Clinical Impressions(s) / ED Diagnoses   Final diagnoses:  Right lower quadrant abdominal pain  BV (bacterial vaginosis)    ED Discharge Orders         Ordered    ibuprofen (ADVIL,MOTRIN) 600 MG tablet  Every 6 hours PRN     04/30/18 2339    metroNIDAZOLE (FLAGYL) 500 MG tablet  2 times daily     04/30/18 2339           Frederica Kuster, PA-C 05/01/18 1509    Lacretia Leigh, MD 05/06/18 2225

## 2018-05-02 ENCOUNTER — Telehealth: Payer: Self-pay | Admitting: Student

## 2018-05-02 DIAGNOSIS — A749 Chlamydial infection, unspecified: Secondary | ICD-10-CM

## 2018-05-02 MED ORDER — AZITHROMYCIN 500 MG PO TABS: 1000.0000 mg | ORAL_TABLET | Freq: Once | ORAL | 0 refills | Status: AC

## 2018-05-02 NOTE — Telephone Encounter (Addendum)
Haig Prophet tested positive for  Chlamydia. Patient was called by RN and allergies and pharmacy confirmed. Rx sent to pharmacy of choice.   Jorje Guild, NP 05/02/2018 1:27 PM        ----- Message from Bjorn Loser, RN sent at 05/02/2018  1:20 PM EST ----- This patient tested positive for :  Chlamydia  She has" NKDA",  I have informed the patient of her results and confirmed her pharmacy is correct in her chart. Please send Rx.   Thank you,   Bjorn Loser, RN   Results faxed to [facility] Department.

## 2019-12-17 ENCOUNTER — Other Ambulatory Visit: Payer: Self-pay

## 2019-12-17 ENCOUNTER — Ambulatory Visit
Admission: EM | Admit: 2019-12-17 | Discharge: 2019-12-17 | Disposition: A | Discharge status: Home / Self Care | Payer: PRIVATE HEALTH INSURANCE | Attending: Emergency Medicine | Admitting: Emergency Medicine

## 2019-12-17 DIAGNOSIS — Z1152 Encounter for screening for COVID-19: Secondary | ICD-10-CM

## 2019-12-17 DIAGNOSIS — R059 Cough, unspecified: Secondary | ICD-10-CM

## 2019-12-17 DIAGNOSIS — R05 Cough: Secondary | ICD-10-CM | POA: Diagnosis not present

## 2019-12-17 MED ORDER — BENZONATATE 100 MG PO CAPS: 100.0000 mg | ORAL_CAPSULE | Freq: Three times a day (TID) | ORAL | 0 refills | Status: DC

## 2019-12-17 MED ORDER — FLUTICASONE PROPIONATE 50 MCG/ACT NA SUSP: 1.0000 | Freq: Every day | NASAL | 0 refills | Status: DC

## 2019-12-17 MED ORDER — CETIRIZINE HCL 10 MG PO TABS: 10.0000 mg | ORAL_TABLET | Freq: Every day | ORAL | 0 refills | Status: DC

## 2019-12-17 NOTE — ED Provider Notes (Signed)
[name]    CSN: [id number] Arrival date & time: 12/17/19  1744      History   Chief Complaint Chief Complaint  Patient presents with  . Cough    x 4-5 weeks intermittently    HPI Tammy Shah is a 24 y.o. female presenting for chronic cough. Patient provides history: Endorsing 1 month long course of cough that abated, then returned a week ago. No difficulty breathing, chest pain or palpitations, fever, known sick contacts. Has not obtain Covid testing. Does work in living facility.   Past Medical History:  Diagnosis Date  . Fibroids    uterine    There are no problems to display for this patient.   History reviewed. No pertinent surgical history.  OB History   No obstetric history on file.      Home Medications    Prior to Admission medications   Medication Sig Start Date End Date Taking? Authorizing Provider  benzonatate (TESSALON) 100 MG capsule Take 1 capsule (100 mg total) by mouth every 8 (eight) hours. 12/17/19   Hall-Potvin, [place], PA-C  cetirizine (ZYRTEC ALLERGY) 10 MG tablet Take 1 tablet (10 mg total) by mouth daily. 12/17/19   Hall-Potvin, [place], PA-C  fluticasone (FLONASE) 50 MCG/ACT nasal spray Place 1 spray into both nostrils daily. 12/17/19   Hall-Potvin, [place], PA-C  ibuprofen (ADVIL,MOTRIN) 600 MG tablet Take 1 tablet (600 mg total) by mouth every 6 (six) hours as needed. 04/30/18   Frederica Kuster, PA-C  Prenatal Vit-Fe Fumarate-FA (PRENATAL COMPLETE) 14-0.4 MG TABS Take 1 tablet by mouth daily. 03/06/18   Frederica Kuster, PA-C    Family History History reviewed. No pertinent family history.  Social History Social History   Tobacco Use  . Smoking status: Never Smoker  . Smokeless tobacco: Never Used  Vaping Use  . Vaping Use: Never used  Substance Use Topics  . Alcohol use: Yes    Alcohol/week: 3.0 standard drinks    Types: 3 Glasses of wine per week  . Drug use: Never     Allergies   Patient has no  known allergies.   Review of Systems As per HPI   Physical Exam Triage Vital Signs ED Triage Vitals  Enc Vitals Group     BP 12/17/19 1901 120/86     Pulse Rate 12/17/19 1901 89     Resp 12/17/19 1901 18     Temp 12/17/19 1901 98.1 F (36.7 C)     Temp Source 12/17/19 1901 Oral     SpO2 12/17/19 1901 99 %     Weight --      Height --      Head Circumference --      Peak Flow --      Pain Score 12/17/19 1903 0     Pain Loc --      Pain Edu? --      Excl. in [place]? --    No data found.  Updated Vital Signs BP 120/86 (BP Location: Left Arm)   Pulse 89   Temp 98.1 F (36.7 C) (Oral)   Resp 18   LMP 12/16/2019 (Exact Date)   SpO2 99%   Visual Acuity Right Eye Distance:   Left Eye Distance:   Bilateral Distance:    Right Eye Near:   Left Eye Near:    Bilateral Near:     Physical Exam Constitutional:      General: She is not in acute distress.    Appearance:  She is not ill-appearing or diaphoretic.  HENT:     Head: Normocephalic and atraumatic.     Mouth/Throat:     Mouth: Mucous membranes are moist.     Pharynx: Oropharynx is clear. No oropharyngeal exudate or posterior oropharyngeal erythema.  Eyes:     General: No scleral icterus.    Conjunctiva/sclera: Conjunctivae normal.     Pupils: Pupils are equal, round, and reactive to light.  Neck:     Comments: Trachea midline, negative JVD Cardiovascular:     Rate and Rhythm: Normal rate and regular rhythm.     Heart sounds: No murmur heard.  No gallop.   Pulmonary:     Effort: Pulmonary effort is normal. No respiratory distress.     Breath sounds: No wheezing, rhonchi or rales.  Musculoskeletal:     Cervical back: Neck supple. No tenderness.  Lymphadenopathy:     Cervical: No cervical adenopathy.  Skin:    Capillary Refill: Capillary refill takes less than 2 seconds.     Coloration: Skin is not jaundiced or pale.     Findings: No rash.  Neurological:     General: No focal deficit present.     Mental  Status: She is alert and oriented to person, place, and time.      UC Treatments / Results  Labs (all labs ordered are listed, but only abnormal results are displayed) Labs Reviewed  NOVEL CORONAVIRUS, NAA    EKG   Radiology No results found.  Procedures Procedures (including critical care time)  Medications Ordered in UC Medications - No data to display  Initial Impression / Assessment and Plan / UC Course  I have reviewed the triage vital signs and the nursing notes.  Pertinent labs & imaging results that were available during my care of the patient were reviewed by me and considered in my medical decision making (see chart for details).     Patient afebrile, nontoxic, with SpO2 99%.  Covid PCR pending.  Patient to quarantine until results are back.  We will treat supportively as outlined below.  Return precautions discussed, patient verbalized understanding and is agreeable to plan. Final Clinical Impressions(s) / UC Diagnoses   Final diagnoses:  Encounter for screening for COVID-19  Cough     Discharge Instructions     Tessalon for cough. Start flonase, atrovent nasal spray for nasal congestion/drainage. You can use over the counter nasal saline rinse such as neti pot for nasal congestion. Keep hydrated, your urine should be clear to pale yellow in color. Tylenol/motrin for fever and pain. Monitor for any worsening of symptoms, chest pain, shortness of breath, wheezing, swelling of the throat, go to the emergency department for further evaluation needed.     ED Prescriptions    Medication Sig Dispense Auth. Provider   benzonatate (TESSALON) 100 MG capsule Take 1 capsule (100 mg total) by mouth every 8 (eight) hours. 21 capsule Hall-Potvin, [place], PA-C   cetirizine (ZYRTEC ALLERGY) 10 MG tablet Take 1 tablet (10 mg total) by mouth daily. 30 tablet Hall-Potvin, [place], PA-C   fluticasone (FLONASE) 50 MCG/ACT nasal spray Place 1 spray into both nostrils daily.  16 g Hall-Potvin, [place], PA-C     PDMP not reviewed this encounter.   Hall-Potvin, [place], [place] 12/17/19 2016

## 2019-12-17 NOTE — ED Triage Notes (Signed)
Pt states she has had a cough for 4-5 weeks intermittently but no congestion or other symptoms. Pt is aox4 and ambulatory.

## 2019-12-17 NOTE — Discharge Instructions (Signed)

## 2019-12-19 LAB — NOVEL CORONAVIRUS, NAA: SARS-CoV-2, NAA: NOT DETECTED

## 2019-12-19 LAB — SARS-COV-2, NAA 2 DAY TAT

## 2020-03-25 ENCOUNTER — Ambulatory Visit
Admission: EM | Admit: 2020-03-25 | Discharge: 2020-03-25 | Disposition: A | Discharge status: Home / Self Care | Payer: PRIVATE HEALTH INSURANCE | Attending: Internal Medicine | Admitting: Internal Medicine

## 2020-03-25 ENCOUNTER — Other Ambulatory Visit: Payer: Self-pay

## 2020-03-25 DIAGNOSIS — Z20822 Contact with and (suspected) exposure to covid-19: Secondary | ICD-10-CM

## 2020-03-25 DIAGNOSIS — R058 Other specified cough: Secondary | ICD-10-CM

## 2020-03-25 MED ORDER — BENZONATATE 100 MG PO CAPS: 100.0000 mg | ORAL_CAPSULE | Freq: Three times a day (TID) | ORAL | 0 refills | Status: DC

## 2020-03-25 NOTE — Discharge Instructions (Signed)
Increase fluid intake °If your symptoms worsen please return to the urgent care °Take medications as directed °Please quarantine until COVID-19 test results are available °We will call you with recommendations if your lab results are abnormal. °

## 2020-03-25 NOTE — ED Provider Notes (Signed)
[name]    CSN: [id number] Arrival date & time: 03/25/20  0844      History   Chief Complaint Chief Complaint  Patient presents with  . Sore Throat  . Cough    HPI Tammy Shah is a 25 y.o. female comes to urgent care with complaints of sore throat, nasal congestion cough of 2 days duration.  Patient was exposed to a COVID-19 positive individual.  She denies any body aches, fever or chills.  No nausea vomiting or diarrhea.  Patient is fully vaccinated against COVID-19 virus and is not eligible for please start this time.   HPI  Past Medical History:  Diagnosis Date  . Fibroids    uterine    There are no problems to display for this patient.   History reviewed. No pertinent surgical history.  OB History   No obstetric history on file.      Home Medications    Prior to Admission medications   Medication Sig Start Date End Date Taking? Authorizing Provider  ibuprofen (ADVIL,MOTRIN) 600 MG tablet Take 1 tablet (600 mg total) by mouth every 6 (six) hours as needed. 04/30/18   Frederica Kuster, PA-C  Prenatal Vit-Fe Fumarate-FA (PRENATAL COMPLETE) 14-0.4 MG TABS Take 1 tablet by mouth daily. 03/06/18   Law, Bea Graff, PA-C  cetirizine (ZYRTEC ALLERGY) 10 MG tablet Take 1 tablet (10 mg total) by mouth daily. 12/17/19 03/25/20  Hall-Potvin, [place], PA-C  fluticasone (FLONASE) 50 MCG/ACT nasal spray Place 1 spray into both nostrils daily. 12/17/19 03/25/20  Hall-Potvin, [place], PA-C    Family History History reviewed. No pertinent family history.  Social History Social History   Tobacco Use  . Smoking status: Never Smoker  . Smokeless tobacco: Never Used  Vaping Use  . Vaping Use: Never used  Substance Use Topics  . Alcohol use: Yes    Alcohol/week: 3.0 standard drinks    Types: 3 Glasses of wine per week  . Drug use: Never     Allergies   Patient has no known allergies.   Review of Systems Review of Systems  HENT: Positive for  congestion and sore throat.   Respiratory: Positive for cough. Negative for shortness of breath and wheezing.   Cardiovascular: Negative for chest pain.  Musculoskeletal: Negative for back pain and myalgias.  Neurological: Negative for headaches.     Physical Exam Triage Vital Signs ED Triage Vitals  Enc Vitals Group     BP 03/25/20 0925 125/87     Pulse Rate 03/25/20 0925 98     Resp 03/25/20 0925 14     Temp 03/25/20 0925 98.6 F (37 C)     Temp Source 03/25/20 0925 Oral     SpO2 03/25/20 0925 99 %     Weight 03/25/20 0926 220 lb 6.4 oz (100 kg)     Height 03/25/20 0926 5\' 3"  (1.6 m)     Head Circumference --      Peak Flow --      Pain Score 03/25/20 0925 0     Pain Loc --      Pain Edu? --      Excl. in [place]? --    No data found.  Updated Vital Signs BP 125/87   Pulse 98   Temp 98.6 F (37 C) (Oral)   Resp 14   Ht 5\' 3"  (1.6 m)   Wt 100 kg   LMP 03/19/2020 (Approximate)   SpO2 99%   BMI 39.04 kg/m  Visual Acuity Right Eye Distance:   Left Eye Distance:   Bilateral Distance:    Right Eye Near:   Left Eye Near:    Bilateral Near:     Physical Exam Vitals and nursing note reviewed.  Constitutional:      General: She is not in acute distress.    Appearance: She is not ill-appearing.  HENT:     Mouth/Throat:     Pharynx: Uvula midline. No posterior oropharyngeal erythema.     Tonsils: 0 on the right. 0 on the left.  Cardiovascular:     Rate and Rhythm: Normal rate and regular rhythm.  Pulmonary:     Effort: Pulmonary effort is normal.     Breath sounds: Normal breath sounds.  Neurological:     Mental Status: She is alert.      UC Treatments / Results  Labs (all labs ordered are listed, but only abnormal results are displayed) Labs Reviewed  NOVEL CORONAVIRUS, NAA    EKG   Radiology No results found.  Procedures Procedures (including critical care time)  Medications Ordered in UC Medications - No data to display  Initial  Impression / Assessment and Plan / UC Course  I have reviewed the triage vital signs and the nursing notes.  Pertinent labs & imaging results that were available during my care of the patient were reviewed by me and considered in my medical decision making (see chart for details).     1.  Cough in the setting of exposure to confirmed case of COVID-19: Tessalon Perles as needed for cough Increase oral fluid intake COVID-19 PCR test sent We will call you with recommendations if COVID-19 test is positive. Final Clinical Impressions(s) / UC Diagnoses   Final diagnoses:  Exposure to confirmed case of COVID-19  Cough with exposure to COVID-19 virus     Discharge Instructions     Increase fluid intake If your symptoms worsen please return to the urgent care Take medications as directed Please quarantine until COVID-19 test results are available We will call you with recommendations if your lab results are abnormal.   ED Prescriptions    None     PDMP not reviewed this encounter.   Merrilee Jansky, MD 03/25/20 [phone]

## 2020-03-25 NOTE — ED Triage Notes (Signed)
Patient presents to Urgent Care with complaints of sore throat, congestion and cough x2 days. Patient reports boyfriend tested positive for covid on Monday. Pt is vaccinated for both covid and flu. No otc. She would like a work note for work.

## 2020-03-27 LAB — NOVEL CORONAVIRUS, NAA: SARS-CoV-2, NAA: NOT DETECTED

## 2020-03-27 LAB — SARS-COV-2, NAA 2 DAY TAT

## 2020-05-15 IMAGING — CT CT ABD-PELV W/ CM
2 of 4 series · 16 of 46 positions shown, 18 images · IV contrast (Omni 300)
Comparison: None.

CLINICAL DATA: Right lower quadrant pain

EXAM:
CT ABDOMEN AND PELVIS WITH CONTRAST
TECHNIQUE: Multidetector CT imaging of the abdomen and pelvis was performed
using the standard protocol following bolus administration of
intravenous contrast.
CONTRAST:  100mL OMNIPAQUE IOHEXOL 300 MG/ML  SOLN

[Series 3: a/p w/ 5mm · axial · 0.91mm/px · z∈[-901,-456]mm · 13 of 97 slices shown, 15 images]
[im 4/97  soft-tissue]
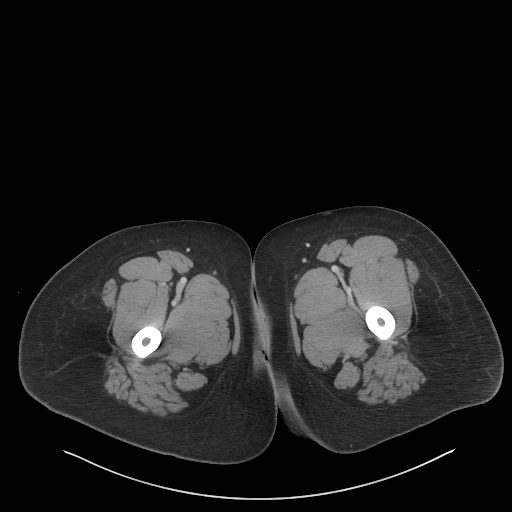
[im 4/97  bone]
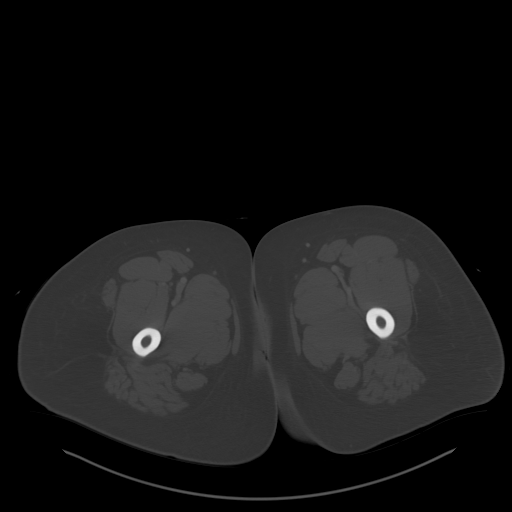
[im 12/97  soft-tissue]
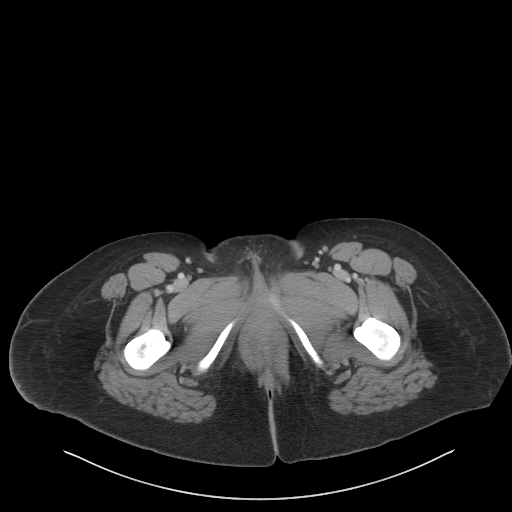
[im 20/97  soft-tissue]
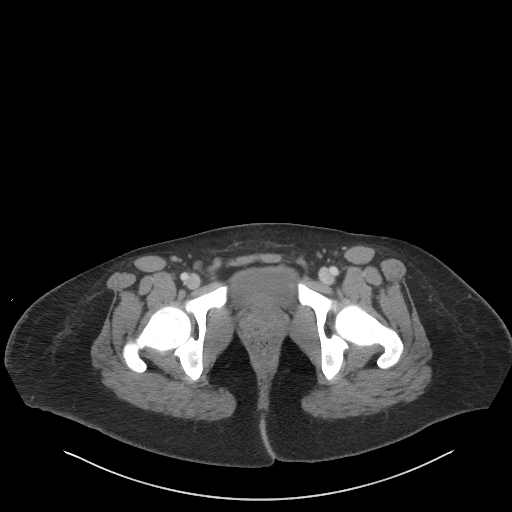
[im 27/97  soft-tissue]
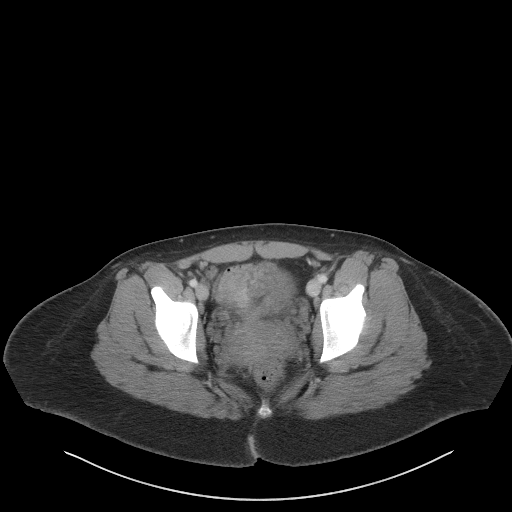
[im 35/97  soft-tissue]
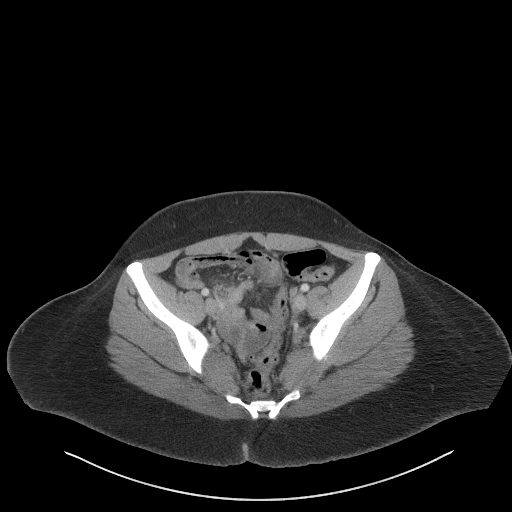
[im 43/97  soft-tissue]
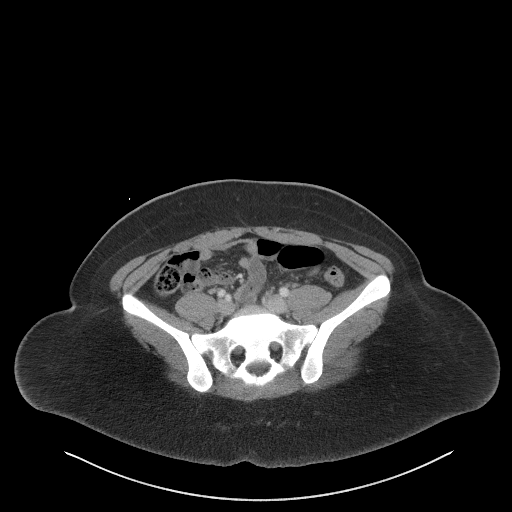
[im 50/97  soft-tissue]
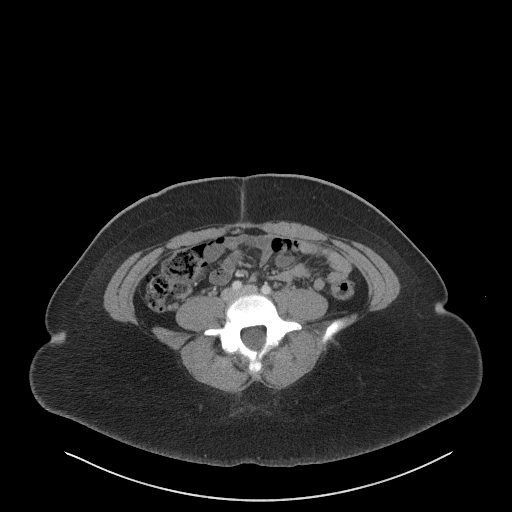
[im 54/97  soft-tissue]
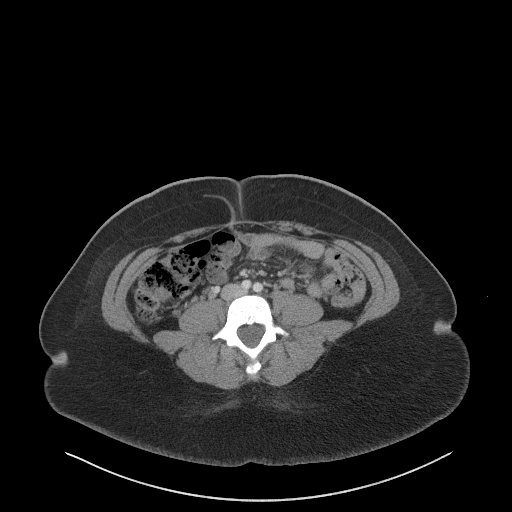
[im 62/97  soft-tissue]
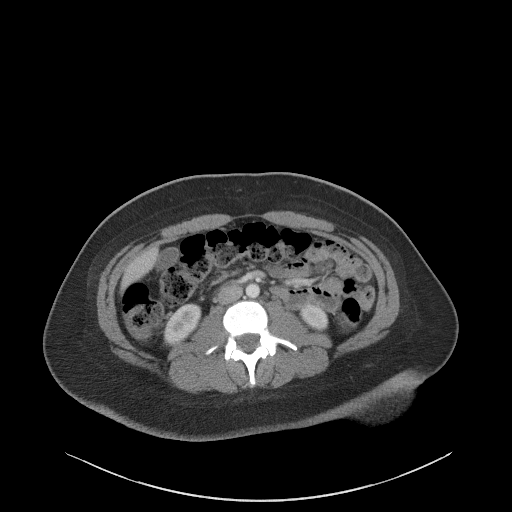
[im 62/97  bone]
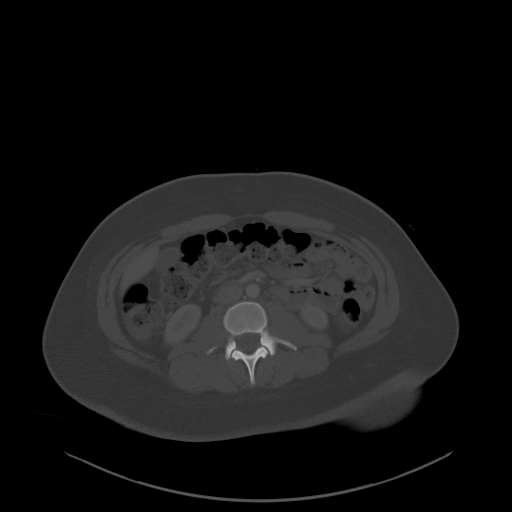
[im 70/97  soft-tissue]
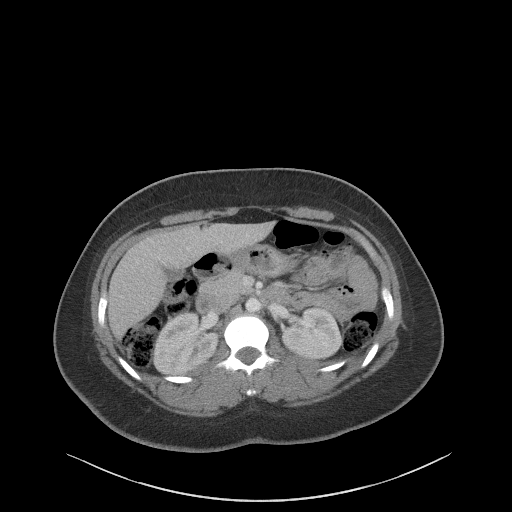
[im 77/97  soft-tissue]
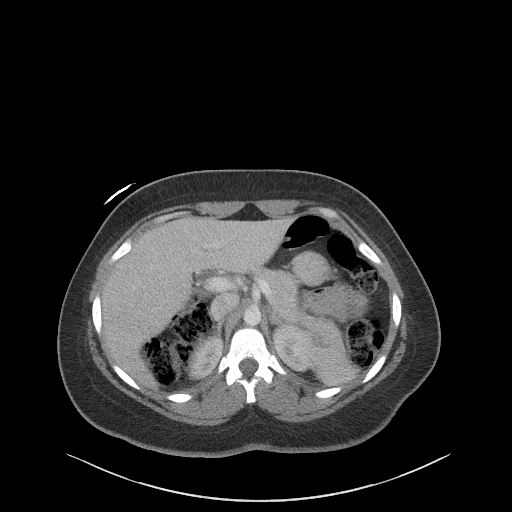
[im 85/97  soft-tissue]
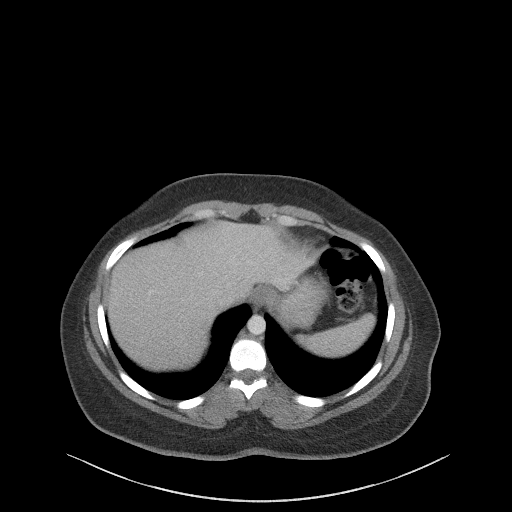
[im 93/97  soft-tissue]
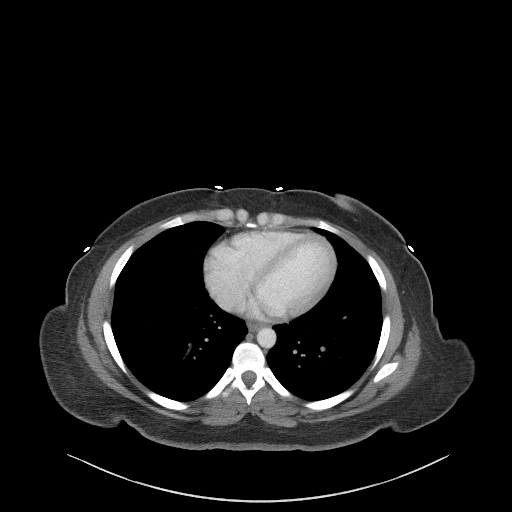

[Series 6: a/p w/ cor · coronal · 0.77mm/px · 3 of 146 slices shown]
[im 49/146  soft-tissue]
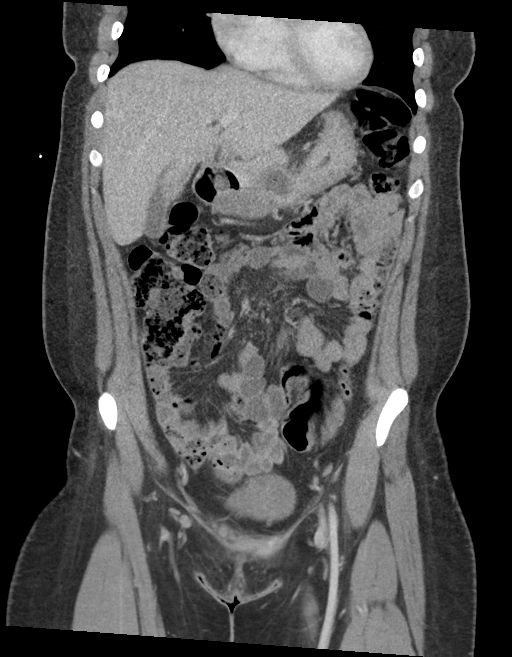
[im 65/146  soft-tissue]
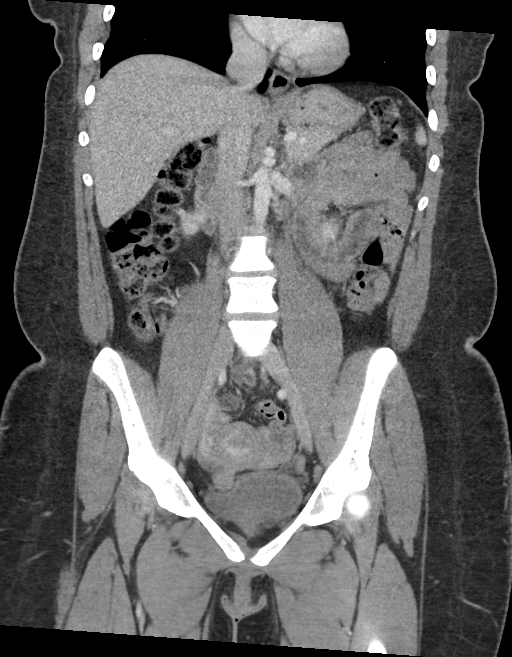
[im 81/146  soft-tissue]
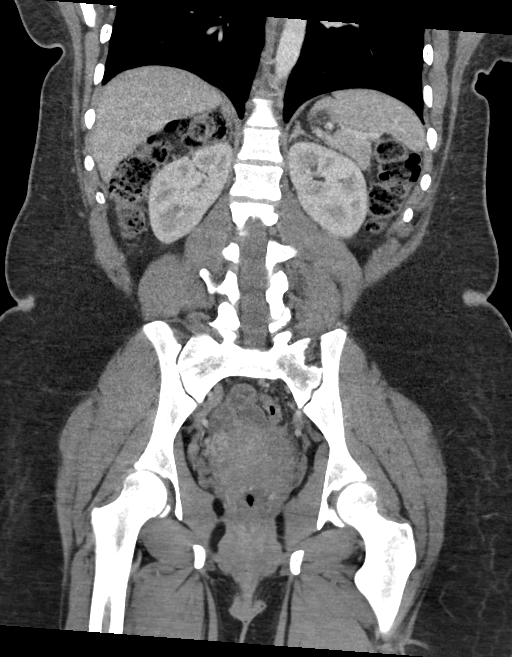

[16 of 46 positions shown; findings below may reference images not displayed]

FINDINGS: Lower chest: No acute abnormality.

Hepatobiliary: No focal liver abnormality is seen. No gallstones,
gallbladder wall thickening, or biliary dilatation.

Pancreas: Unremarkable. No pancreatic ductal dilatation or
surrounding inflammatory changes.

Spleen: Normal in size without focal abnormality.

Adrenals/Urinary Tract: Adrenal glands are unremarkable. Kidneys are
normal, without renal calculi, focal lesion, or hydronephrosis.
Bladder is unremarkable.

Stomach/Bowel: The appendix is within normal limits. No obstructive
or inflammatory changes of the larger small-bowel are noted. Stomach
is within normal limits.

Vascular/Lymphatic: No significant vascular findings are present. No
enlarged abdominal or pelvic lymph nodes.

Reproductive: Uterus and bilateral adnexa are unremarkable.

Other: No abdominal wall hernia or abnormality. No abdominopelvic
ascites.

Musculoskeletal: Unilateral pars defect is noted at L5 on the left.
Posterior fusion defect at L5 is noted as well. No acute bony
abnormality is noted.
IMPRESSION: The appendix is within normal limits.

No acute abnormality noted.

## 2020-08-02 ENCOUNTER — Encounter: Payer: Self-pay | Admitting: Emergency Medicine

## 2020-08-02 ENCOUNTER — Other Ambulatory Visit: Payer: Self-pay

## 2020-08-02 ENCOUNTER — Ambulatory Visit
Admission: EM | Admit: 2020-08-02 | Discharge: 2020-08-02 | Disposition: A | Discharge status: Home / Self Care | Payer: PRIVATE HEALTH INSURANCE | Attending: Physician Assistant | Admitting: Physician Assistant

## 2020-08-02 DIAGNOSIS — J029 Acute pharyngitis, unspecified: Secondary | ICD-10-CM | POA: Diagnosis not present

## 2020-08-02 NOTE — ED Triage Notes (Signed)
Pt is present today with vomiting, fatigue, sore throat, and nasal congestion. Pt states that her sx started Wednesday

## 2020-08-02 NOTE — ED Provider Notes (Signed)
[name]    CSN: [id number] Arrival date & time: 08/02/20  1534      History   Chief Complaint Chief Complaint  Patient presents with  . Emesis  . Fatigue  . Nasal Congestion    HPI Tammy Shah is a 25 y.o. female.   Pt presents with nasal congestion, sore throat that started 5-6 days ago.  She reports initially she was also experiencing nausea and vomiting, that has resolved.  She is using flonase.  She is vaccinated against COVID.  She denies recent sick contacts. Denies cough, shortness of breath, wheezing.       Past Medical History:  Diagnosis Date  . Fibroids    uterine    There are no problems to display for this patient.   History reviewed. No pertinent surgical history.  OB History   No obstetric history on file.      Home Medications    Prior to Admission medications   Medication Sig Start Date End Date Taking? Authorizing Provider  benzonatate (TESSALON) 100 MG capsule Take 1 capsule (100 mg total) by mouth every 8 (eight) hours. 03/25/20   Lamptey, Myrene Galas, MD  ibuprofen (ADVIL,MOTRIN) 600 MG tablet Take 1 tablet (600 mg total) by mouth every 6 (six) hours as needed. 04/30/18   Frederica Kuster, PA-C  Prenatal Vit-Fe Fumarate-FA (PRENATAL COMPLETE) 14-0.4 MG TABS Take 1 tablet by mouth daily. 03/06/18   Law, Bea Graff, PA-C  cetirizine (ZYRTEC ALLERGY) 10 MG tablet Take 1 tablet (10 mg total) by mouth daily. 12/17/19 03/25/20  Hall-Potvin, [place], PA-C  fluticasone (FLONASE) 50 MCG/ACT nasal spray Place 1 spray into both nostrils daily. 12/17/19 03/25/20  Hall-Potvin, [place], PA-C    Family History History reviewed. No pertinent family history.  Social History Social History   Tobacco Use  . Smoking status: Never Smoker  . Smokeless tobacco: Never Used  Vaping Use  . Vaping Use: Never used  Substance Use Topics  . Alcohol use: Yes    Alcohol/week: 3.0 standard drinks    Types: 3 Glasses of wine per week  . Drug use:  Never     Allergies   Patient has no known allergies.   Review of Systems Review of Systems  Constitutional: Negative for chills and fever.  HENT: Positive for congestion and sore throat. Negative for ear pain.   Eyes: Negative for pain and visual disturbance.  Respiratory: Negative for cough and shortness of breath.   Cardiovascular: Negative for chest pain and palpitations.  Gastrointestinal: Negative for abdominal pain and vomiting.  Genitourinary: Negative for dysuria and hematuria.  Musculoskeletal: Negative for arthralgias and back pain.  Skin: Negative for color change and rash.  Neurological: Negative for seizures and syncope.  All other systems reviewed and are negative.    Physical Exam Triage Vital Signs ED Triage Vitals  Enc Vitals Group     BP 08/02/20 1750 136/82     Pulse Rate 08/02/20 1750 (!) 2     Resp 08/02/20 1750 18     Temp 08/02/20 1750 98.9 F (37.2 C)     Temp Source 08/02/20 1750 Oral     SpO2 08/02/20 1750 98 %     Weight --      Height --      Head Circumference --      Peak Flow --      Pain Score 08/02/20 1749 3     Pain Loc --      Pain  Edu? --      Excl. in [place]? --    No data found.  Updated Vital Signs BP 136/82   Pulse (!) 2   Temp 98.9 F (37.2 C) (Oral)   Resp 18   LMP 07/09/2020   SpO2 98%   Visual Acuity Right Eye Distance:   Left Eye Distance:   Bilateral Distance:    Right Eye Near:   Left Eye Near:    Bilateral Near:     Physical Exam Vitals and nursing note reviewed.  Constitutional:      General: She is not in acute distress.    Appearance: She is well-developed.  HENT:     Head: Normocephalic and atraumatic.     Mouth/Throat:     Pharynx: Uvula midline. Posterior oropharyngeal erythema present. No pharyngeal swelling.     Tonsils: No tonsillar exudate.  Eyes:     Conjunctiva/sclera: Conjunctivae normal.  Cardiovascular:     Rate and Rhythm: Normal rate and regular rhythm.     Heart sounds: No  murmur heard.   Pulmonary:     Effort: Pulmonary effort is normal. No respiratory distress.     Breath sounds: Normal breath sounds.  Abdominal:     Palpations: Abdomen is soft.     Tenderness: There is no abdominal tenderness.  Musculoskeletal:     Cervical back: Neck supple.  Skin:    General: Skin is warm and dry.  Neurological:     Mental Status: She is alert.      UC Treatments / Results  Labs (all labs ordered are listed, but only abnormal results are displayed) Labs Reviewed  NOVEL CORONAVIRUS, NAA    EKG   Radiology No results found.  Procedures Procedures (including critical care time)  Medications Ordered in UC Medications - No data to display  Initial Impression / Assessment and Plan / UC Course  I have reviewed the triage vital signs and the nursing notes.  Pertinent labs & imaging results that were available during my care of the patient were reviewed by me and considered in my medical decision making (see chart for details).     COVID test pending.  Sx not consistent with strep throat.  Advised salt water gargles, ibuprofen as needed.  She will continue with flonase. Return precautions discussed.  Final Clinical Impressions(s) / UC Diagnoses   Final diagnoses:  Acute pharyngitis, unspecified etiology     Discharge Instructions     Take ibuprofen or tylenol as needed.  Recommend salt water gargles Continue with Flonase COVID test pending.   ED Prescriptions    None     PDMP not reviewed this encounter.   Konrad Felix, PA-C 08/02/20 [id number]

## 2020-08-02 NOTE — Discharge Instructions (Addendum)
Take ibuprofen or tylenol as needed.  Recommend salt water gargles Continue with Flonase COVID test pending.

## 2020-08-04 LAB — SARS-COV-2, NAA 2 DAY TAT

## 2020-08-04 LAB — NOVEL CORONAVIRUS, NAA: SARS-CoV-2, NAA: NOT DETECTED

## 2020-11-25 IMAGING — US US OB COMP LESS 14 WK
2 series · 14 of 21 positions shown · non-contrast
Comparison: Pelvic ultrasound on 11/14/2017

CLINICAL DATA: Positive pregnancy test.  Unsure of LMP.  Fibroids.

EXAM:
OBSTETRIC <14 WK ULTRASOUND
TECHNIQUE: Transabdominal ultrasound was performed for evaluation of the
gestation as well as the maternal uterus and adnexal regions.

[Series 1: us ob comp less 14 wk · 12 acquisitions, 8 frames shown (1 of 2)]
[im 1/12]
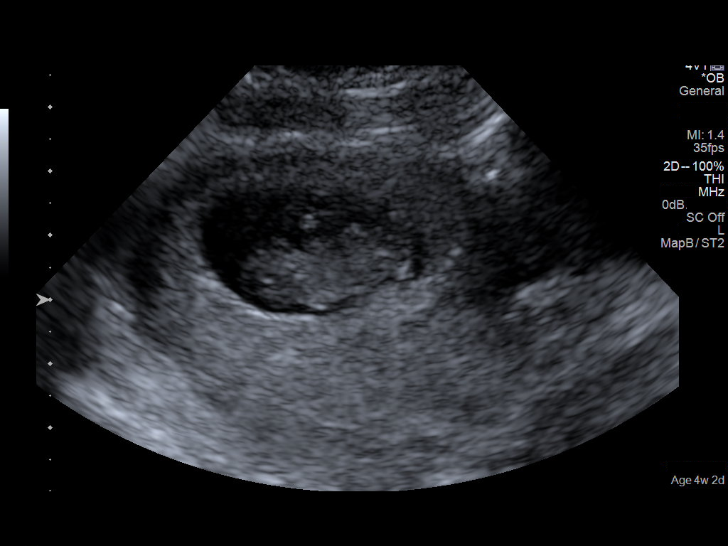
[im 3/12]
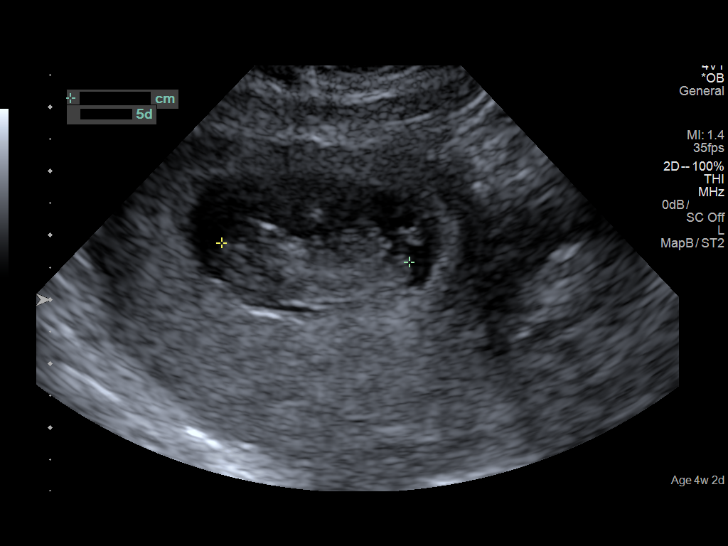
[im 4/12]
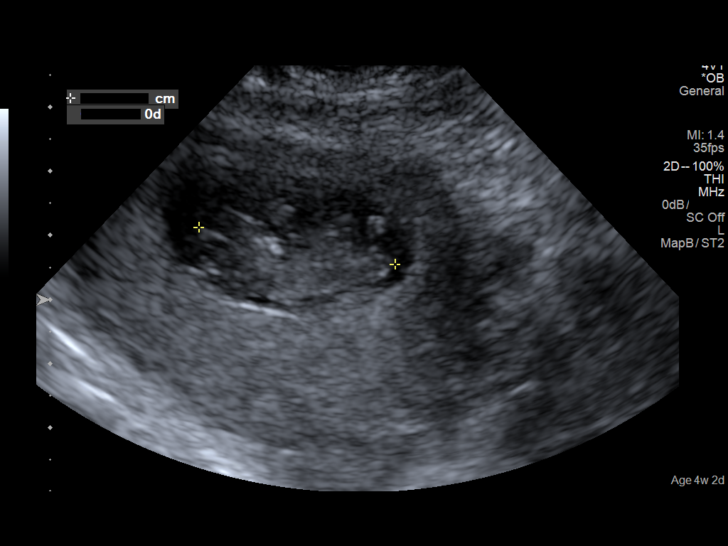
[im 6/12]
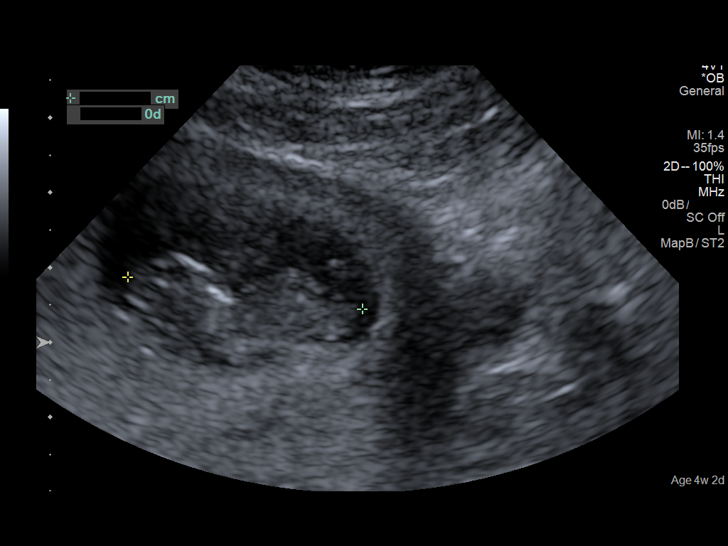
[im 7/12]
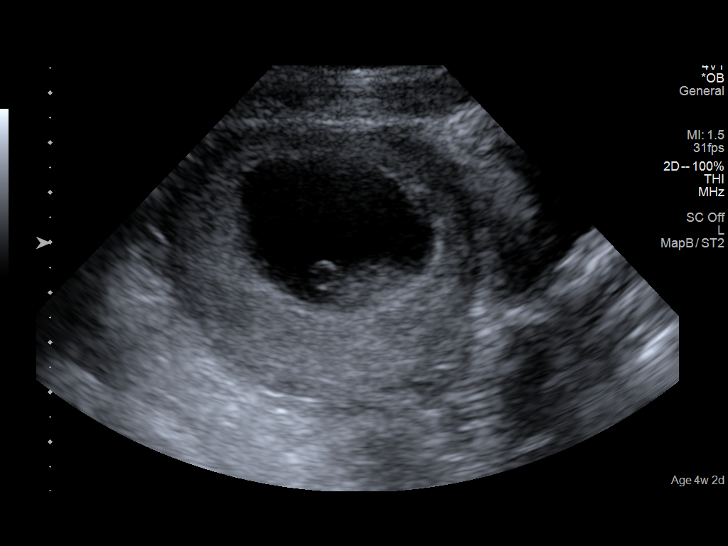
[im 9/12]
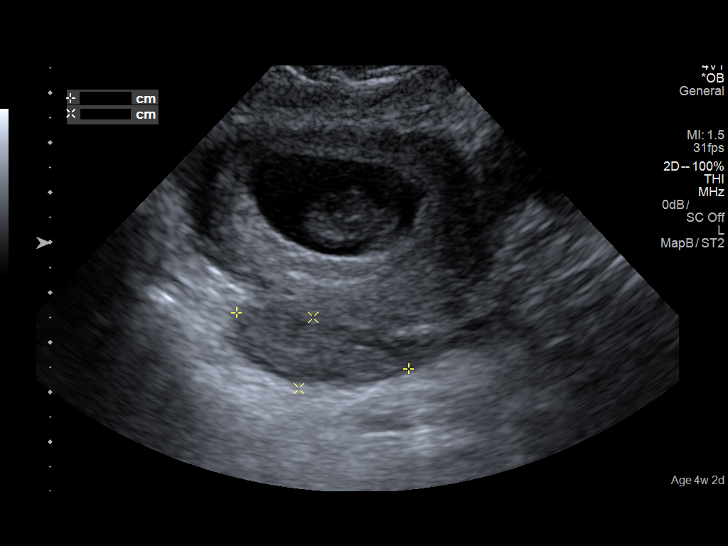
[im 10/12]
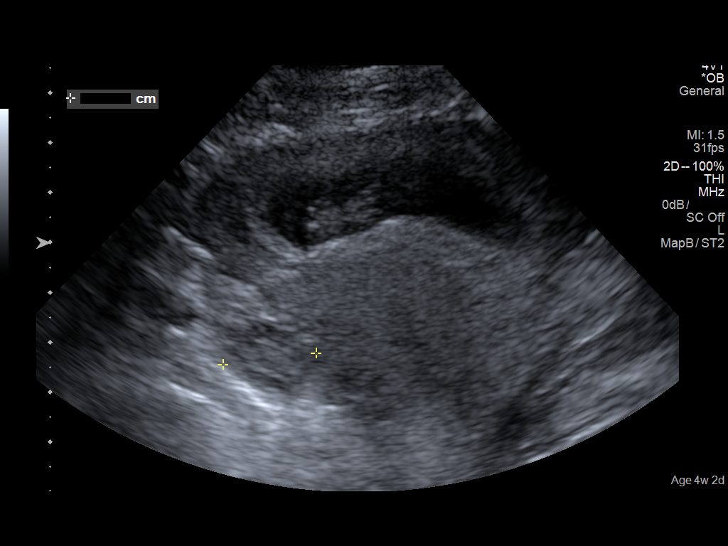
[im 12/12]
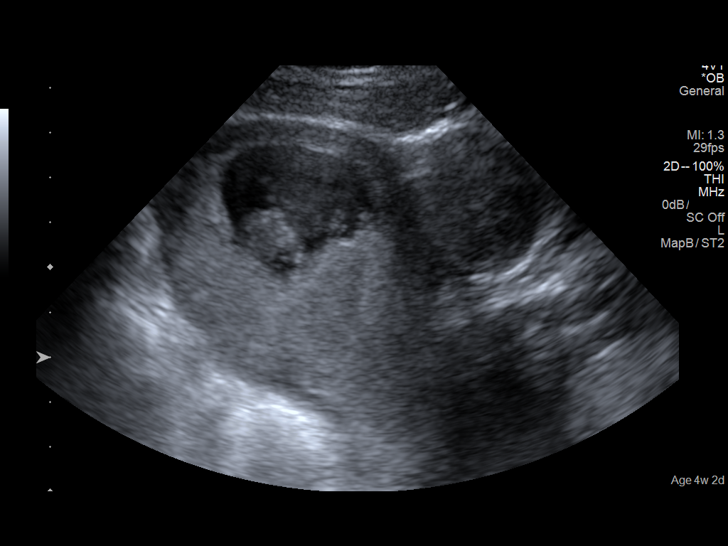

[Series 1001: us ob comp less 14 wk · 9 acquisitions, 6 frames shown (2 of 2)]
[im 1/9]
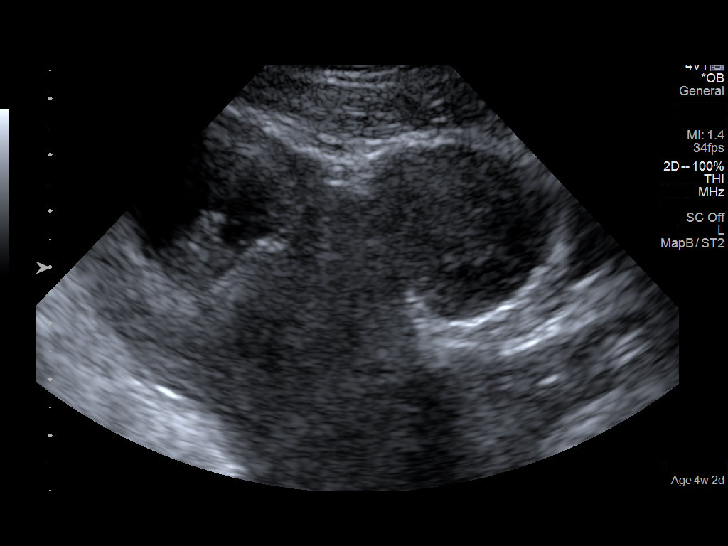
[im 3/9]
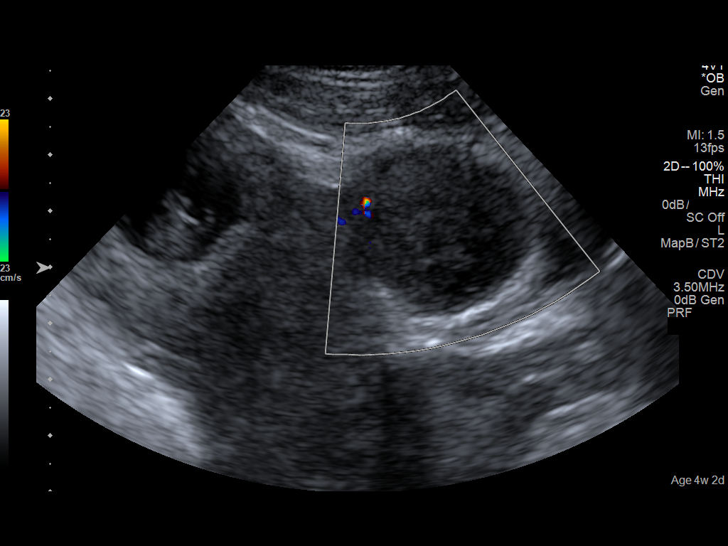
[im 4/9]
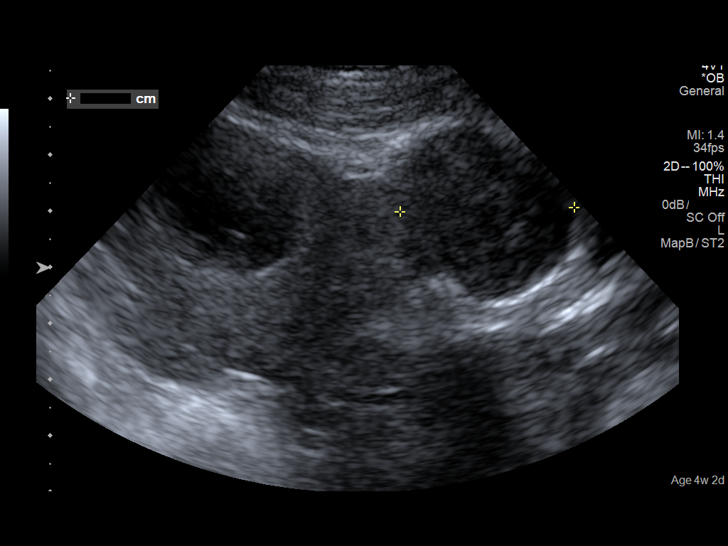
[im 6/9]
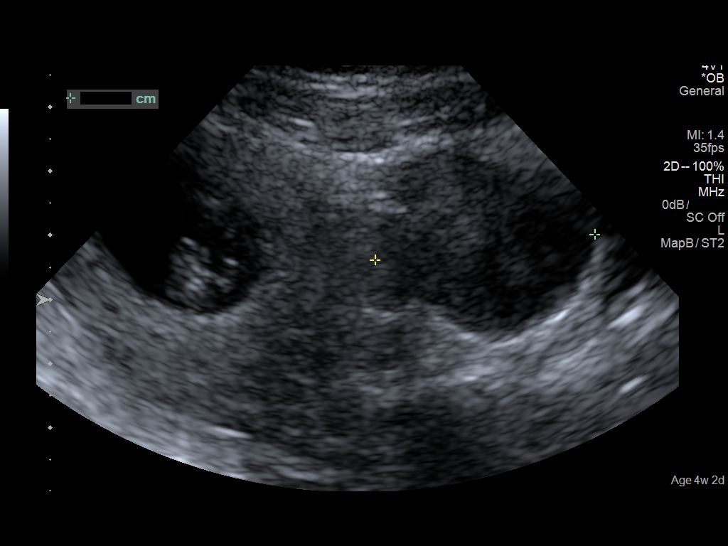
[im 7/9]
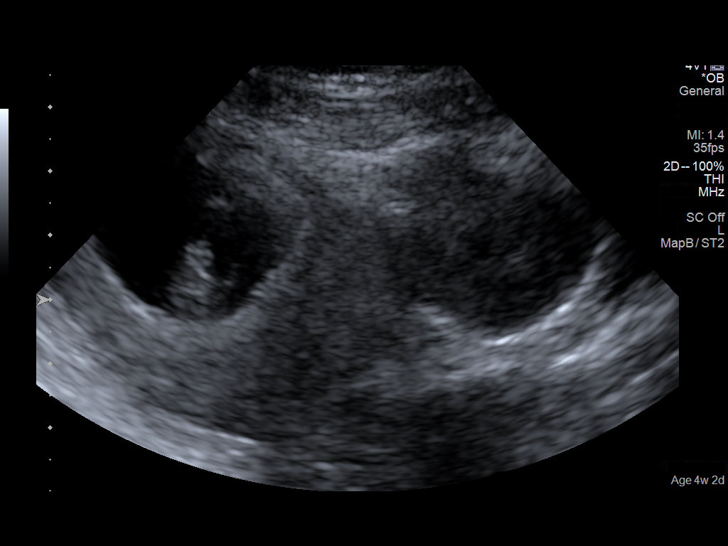
[im 9/9]
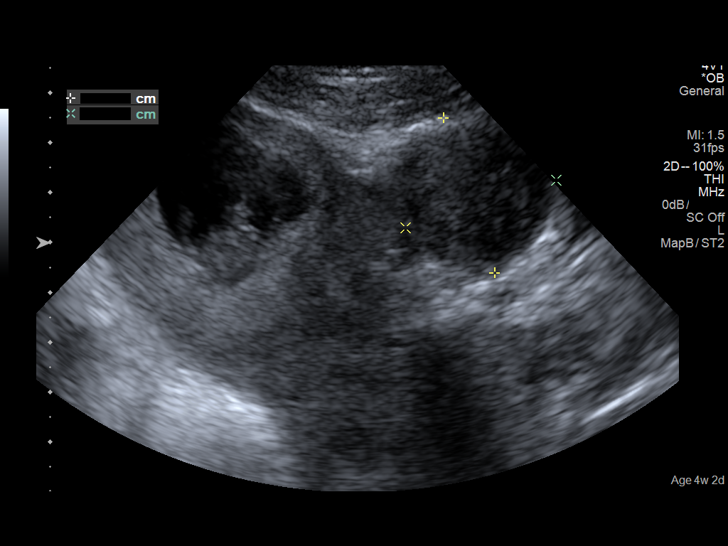

[14 of 21 positions shown; findings below may reference images not displayed]

FINDINGS: Intrauterine gestational sac: Single

Yolk sac:  Visualized.

Embryo:  Visualized.

Cardiac Activity: Visualized.

Heart Rate: 192 bpm

CRL:   30 mm   10 w 0 d                  US EDC: 10/02/2018

Subchorionic hemorrhage:  None visualized.

Maternal uterus/adnexae: A pedunculated subserosal fibroid is seen
arising from the left anterior corpus which measures 3.4 cm. Right
ovary is normal in appearance. Left ovary is not directly
visualized, however no other adnexal mass identified.
IMPRESSION: Single living IUP measuring 10 weeks 0 days, with US EDC of
10/02/2018.

3.4 cm pedunculated fibroid arising from the left anterior uterine
corpus.

## 2021-01-18 ENCOUNTER — Encounter: Payer: Self-pay | Admitting: Emergency Medicine

## 2021-01-18 ENCOUNTER — Ambulatory Visit
Admission: EM | Admit: 2021-01-18 | Discharge: 2021-01-18 | Disposition: A | Discharge status: Home / Self Care | Payer: 59 | Attending: Internal Medicine | Admitting: Internal Medicine

## 2021-01-18 ENCOUNTER — Other Ambulatory Visit: Payer: Self-pay

## 2021-01-18 DIAGNOSIS — R051 Acute cough: Secondary | ICD-10-CM

## 2021-01-18 MED ORDER — FLUTICASONE PROPIONATE 50 MCG/ACT NA SUSP: 1.0000 | Freq: Every day | NASAL | 0 refills | Status: AC

## 2021-01-18 MED ORDER — BENZONATATE 100 MG PO CAPS: 100.0000 mg | ORAL_CAPSULE | Freq: Three times a day (TID) | ORAL | 0 refills | Status: AC | PRN

## 2021-01-18 MED ORDER — CETIRIZINE HCL 10 MG PO TABS: 10.0000 mg | ORAL_TABLET | Freq: Every day | ORAL | 0 refills | Status: AC

## 2021-01-18 NOTE — Discharge Instructions (Signed)
Your covid 19 test is pending. We will call if it is positive. You have been prescribed 3 medications to help alleviate symptoms.

## 2021-01-18 NOTE — ED Triage Notes (Signed)
Started coughing yesterday. Denies fever, nasal drainage. Requesting tessalon pearls, states she usually needs them around this time every year.

## 2021-01-18 NOTE — ED Provider Notes (Signed)
[name]    CSN: [id number] Arrival date & time: 01/18/21  2979      History   Chief Complaint Chief Complaint  Patient presents with   Cough    HPI Tammy Shah is a 25 y.o. female.   Patient presents productive cough that started yesterday.  Patient not sure of color of sputum.  Denies any other symptoms including fever, nasal congestion, sore throat, ear pain, nausea, vomiting, diarrhea, chest pain, shortness of breath.  Denies any known sick contacts.  She has taken DayQuil with minimal improvement in symptoms.  Patient requesting Ladona Ridgel as these typically work best for her for cough.    Cough  Past Medical History:  Diagnosis Date   Fibroids    uterine    There are no problems to display for this patient.   History reviewed. No pertinent surgical history.  OB History   No obstetric history on file.      Home Medications    Prior to Admission medications   Medication Sig Start Date End Date Taking? Authorizing Provider  benzonatate (TESSALON) 100 MG capsule Take 1 capsule (100 mg total) by mouth every 8 (eight) hours as needed for cough. 01/18/21  Yes Tymier Lindholm, Michele Rockers, FNP  cetirizine (ZYRTEC) 10 MG tablet Take 1 tablet (10 mg total) by mouth daily. 01/18/21  Yes Dodd Schmid, Hildred Alamin E, FNP  fluticasone (FLONASE) 50 MCG/ACT nasal spray Place 1 spray into both nostrils daily for 3 days. 01/18/21 01/21/21 Yes Evea Sheek, Michele Rockers, FNP  ibuprofen (ADVIL,MOTRIN) 600 MG tablet Take 1 tablet (600 mg total) by mouth every 6 (six) hours as needed. 04/30/18   Frederica Kuster, PA-C  Prenatal Vit-Fe Fumarate-FA (PRENATAL COMPLETE) 14-0.4 MG TABS Take 1 tablet by mouth daily. 03/06/18   Frederica Kuster, PA-C    Family History History reviewed. No pertinent family history.  Social History Social History   Tobacco Use   Smoking status: Never   Smokeless tobacco: Never  Vaping Use   Vaping Use: Never used  Substance Use Topics   Alcohol use: Yes     Alcohol/week: 3.0 standard drinks    Types: 3 Glasses of wine per week   Drug use: Never     Allergies   Patient has no known allergies.   Review of Systems Review of Systems Per HPI  Physical Exam Triage Vital Signs ED Triage Vitals  Enc Vitals Group     BP 01/18/21 0903 126/88     Pulse Rate 01/18/21 0903 91     Resp 01/18/21 0903 16     Temp 01/18/21 0903 98.7 F (37.1 C)     Temp Source 01/18/21 0903 Oral     SpO2 01/18/21 0903 99 %     Weight --      Height --      Head Circumference --      Peak Flow --      Pain Score 01/18/21 0904 0     Pain Loc --      Pain Edu? --      Excl. in [place]? --    No data found.  Updated Vital Signs BP 126/88 (BP Location: Left Arm)   Pulse 91   Temp 98.7 F (37.1 C) (Oral)   Resp 16   SpO2 99%   Visual Acuity Right Eye Distance:   Left Eye Distance:   Bilateral Distance:    Right Eye Near:   Left Eye Near:    Bilateral  Near:     Physical Exam Constitutional:      General: She is not in acute distress.    Appearance: Normal appearance. She is not toxic-appearing or diaphoretic.  HENT:     Head: Normocephalic and atraumatic.     Right Ear: Ear canal normal. A middle ear effusion is present. Tympanic membrane is not perforated, erythematous, retracted or bulging.     Left Ear: Ear canal normal. A middle ear effusion is present. Tympanic membrane is not perforated, erythematous, retracted or bulging.     Nose: Congestion present.     Mouth/Throat:     Lips: Pink.     Mouth: Mucous membranes are moist.     Pharynx: No posterior oropharyngeal erythema.  Eyes:     Extraocular Movements: Extraocular movements intact.     Conjunctiva/sclera: Conjunctivae normal.     Pupils: Pupils are equal, round, and reactive to light.  Cardiovascular:     Rate and Rhythm: Normal rate and regular rhythm.     Pulses: Normal pulses.     Heart sounds: Normal heart sounds.  Pulmonary:     Effort: Pulmonary effort is normal. No  respiratory distress.     Breath sounds: Normal breath sounds. No wheezing.  Abdominal:     General: Abdomen is flat. Bowel sounds are normal.     Palpations: Abdomen is soft.  Musculoskeletal:        General: Normal range of motion.     Cervical back: Normal range of motion.  Skin:    General: Skin is warm and dry.  Neurological:     General: No focal deficit present.     Mental Status: She is alert and oriented to person, place, and time. Mental status is at baseline.  Psychiatric:        Mood and Affect: Mood normal.        Behavior: Behavior normal.     UC Treatments / Results  Labs (all labs ordered are listed, but only abnormal results are displayed) Labs Reviewed  NOVEL CORONAVIRUS, NAA    EKG   Radiology No results found.  Procedures Procedures (including critical care time)  Medications Ordered in UC Medications - No data to display  Initial Impression / Assessment and Plan / UC Course  I have reviewed the triage vital signs and the nursing notes.  Pertinent labs & imaging results that were available during my care of the patient were reviewed by me and considered in my medical decision making (see chart for details).     Differential diagnoses include viral upper respiratory infection or allergic rhinitis.  Will treat with cetirizine and Flonase for middle ear effusion.  Benzonatate to take as needed for cough per patient request.  Covid 19 testing pending.  No red flags seen on exam.  Discussed strict return precautions.  Patient verbalized understanding and was agreeable with plan. Final Clinical Impressions(s) / UC Diagnoses   Final diagnoses:  Acute cough     Discharge Instructions      Your covid 19 test is pending. We will call if it is positive. You have been prescribed 3 medications to help alleviate symptoms.      ED Prescriptions     Medication Sig Dispense Auth. Provider   cetirizine (ZYRTEC) 10 MG tablet Take 1 tablet (10 mg total)  by mouth daily. 30 tablet [place], [place] E, [place]   fluticasone [facility]) 50 MCG/ACT nasal spray Place 1 spray into both nostrils daily for 3 days. [place]  g Oswaldo Conroy E, [place]   benzonatate (TESSALON) 100 MG capsule Take 1 capsule (100 mg total) by mouth every 8 (eight) hours as needed for cough. 21 capsule [place], Michele Rockers, [place]      PDMP not reviewed this encounter.   Teodora Medici, [place] 01/18/21 [phone]

## 2021-01-20 LAB — SARS-COV-2, NAA 2 DAY TAT

## 2021-01-20 LAB — NOVEL CORONAVIRUS, NAA: SARS-CoV-2, NAA: NOT DETECTED

## 2021-02-28 DIAGNOSIS — B3731 Acute candidiasis of vulva and vagina: Secondary | ICD-10-CM | POA: Diagnosis not present

## 2021-04-06 DIAGNOSIS — Z0000 Encounter for general adult medical examination without abnormal findings: Secondary | ICD-10-CM | POA: Diagnosis not present

## 2021-08-08 ENCOUNTER — Other Ambulatory Visit (HOSPITAL_COMMUNITY)
Admission: RE | Admit: 2021-08-08 | Discharge: 2021-08-08 | Disposition: A | Discharge status: Home / Self Care | Payer: BC Managed Care – PPO | Source: Ambulatory Visit | Attending: Obstetrics and Gynecology | Admitting: Obstetrics and Gynecology

## 2021-08-08 DIAGNOSIS — Z113 Encounter for screening for infections with a predominantly sexual mode of transmission: Secondary | ICD-10-CM | POA: Diagnosis not present

## 2021-08-08 DIAGNOSIS — D259 Leiomyoma of uterus, unspecified: Secondary | ICD-10-CM | POA: Diagnosis not present

## 2021-08-08 DIAGNOSIS — Z8742 Personal history of other diseases of the female genital tract: Secondary | ICD-10-CM | POA: Diagnosis not present

## 2021-08-08 DIAGNOSIS — Z01419 Encounter for gynecological examination (general) (routine) without abnormal findings: Secondary | ICD-10-CM | POA: Diagnosis not present

## 2021-08-22 DIAGNOSIS — D259 Leiomyoma of uterus, unspecified: Secondary | ICD-10-CM | POA: Diagnosis not present

## 2023-01-26 DIAGNOSIS — J029 Acute pharyngitis, unspecified: Secondary | ICD-10-CM | POA: Diagnosis not present
